# Patient Record
Sex: Female | Born: 1975 | Race: Black or African American | Hispanic: No | Marital: Single | Smoking: Never smoker
Health system: Southern US, Community
[De-identification: ages and names within clinical notes are randomized; demographics above are authoritative.]

## PROBLEM LIST (undated history)

## (undated) DIAGNOSIS — M1990 Unspecified osteoarthritis, unspecified site: Secondary | ICD-10-CM

## (undated) DIAGNOSIS — K219 Gastro-esophageal reflux disease without esophagitis: Secondary | ICD-10-CM

## (undated) DIAGNOSIS — D649 Anemia, unspecified: Secondary | ICD-10-CM

## (undated) DIAGNOSIS — E0500 Thyrotoxicosis with diffuse goiter without thyrotoxic crisis or storm: Secondary | ICD-10-CM

## (undated) DIAGNOSIS — E079 Disorder of thyroid, unspecified: Secondary | ICD-10-CM

## (undated) DIAGNOSIS — I10 Essential (primary) hypertension: Secondary | ICD-10-CM

## (undated) DIAGNOSIS — F419 Anxiety disorder, unspecified: Secondary | ICD-10-CM

## (undated) HISTORY — PX: LESION REMOVAL: SHX5196

## (undated) HISTORY — DX: Unspecified osteoarthritis, unspecified site: M19.90

## (undated) HISTORY — DX: Anemia, unspecified: D64.9

## (undated) HISTORY — DX: Gastro-esophageal reflux disease without esophagitis: K21.9

## (undated) HISTORY — DX: Thyrotoxicosis with diffuse goiter without thyrotoxic crisis or storm: E05.00

## (undated) HISTORY — DX: Disorder of thyroid, unspecified: E07.9

## (undated) HISTORY — DX: Anxiety disorder, unspecified: F41.9

## (undated) HISTORY — PX: WISDOM TOOTH EXTRACTION: SHX21

---

## 2016-01-06 DIAGNOSIS — I10 Essential (primary) hypertension: Secondary | ICD-10-CM | POA: Diagnosis not present

## 2016-02-10 ENCOUNTER — Encounter (HOSPITAL_COMMUNITY): Payer: Self-pay | Admitting: Emergency Medicine

## 2016-02-10 ENCOUNTER — Emergency Department (HOSPITAL_COMMUNITY): Payer: BLUE CROSS/BLUE SHIELD

## 2016-02-10 ENCOUNTER — Inpatient Hospital Stay (HOSPITAL_COMMUNITY)
Admission: EM | Admit: 2016-02-10 | Discharge: 2016-02-12 | DRG: 690 | Disposition: A | Discharge status: Home / Self Care | Payer: BLUE CROSS/BLUE SHIELD | Attending: Internal Medicine | Admitting: Internal Medicine

## 2016-02-10 DIAGNOSIS — R000 Tachycardia, unspecified: Secondary | ICD-10-CM | POA: Diagnosis present

## 2016-02-10 DIAGNOSIS — R1110 Vomiting, unspecified: Secondary | ICD-10-CM | POA: Diagnosis not present

## 2016-02-10 DIAGNOSIS — D509 Iron deficiency anemia, unspecified: Secondary | ICD-10-CM | POA: Diagnosis not present

## 2016-02-10 DIAGNOSIS — R112 Nausea with vomiting, unspecified: Secondary | ICD-10-CM | POA: Diagnosis not present

## 2016-02-10 DIAGNOSIS — R1013 Epigastric pain: Secondary | ICD-10-CM | POA: Diagnosis not present

## 2016-02-10 DIAGNOSIS — E872 Acidosis: Secondary | ICD-10-CM | POA: Diagnosis present

## 2016-02-10 DIAGNOSIS — N920 Excessive and frequent menstruation with regular cycle: Secondary | ICD-10-CM | POA: Diagnosis not present

## 2016-02-10 DIAGNOSIS — I10 Essential (primary) hypertension: Secondary | ICD-10-CM | POA: Diagnosis not present

## 2016-02-10 DIAGNOSIS — D259 Leiomyoma of uterus, unspecified: Secondary | ICD-10-CM | POA: Diagnosis not present

## 2016-02-10 DIAGNOSIS — N390 Urinary tract infection, site not specified: Principal | ICD-10-CM | POA: Diagnosis present

## 2016-02-10 DIAGNOSIS — E860 Dehydration: Secondary | ICD-10-CM | POA: Diagnosis not present

## 2016-02-10 HISTORY — DX: Essential (primary) hypertension: I10

## 2016-02-10 LAB — URINALYSIS, ROUTINE W REFLEX MICROSCOPIC
Bilirubin Urine: NEGATIVE
Glucose, UA: NEGATIVE mg/dL
Ketones, ur: >80 mg/dL — AB
Nitrite: NEGATIVE
Protein, ur: NEGATIVE mg/dL
Specific Gravity, Urine: 1.027 (ref 1.005–1.030)
pH: 5.5 (ref 5.0–8.0)

## 2016-02-10 LAB — CBC WITH DIFFERENTIAL/PLATELET
Basophils Absolute: 0 10*3/uL (ref 0.0–0.1)
Basophils Relative: 0 %
Eosinophils Absolute: 0 10*3/uL (ref 0.0–0.7)
Eosinophils Relative: 0 %
HCT: 39.7 % (ref 36.0–46.0)
Hemoglobin: 12.9 g/dL (ref 12.0–15.0)
Lymphocytes Relative: 27 %
Lymphs Abs: 2.2 10*3/uL (ref 0.7–4.0)
MCH: 25.5 pg — ABNORMAL LOW (ref 26.0–34.0)
MCHC: 32.5 g/dL (ref 30.0–36.0)
MCV: 78.5 fL (ref 78.0–100.0)
Monocytes Absolute: 1.1 10*3/uL — ABNORMAL HIGH (ref 0.1–1.0)
Monocytes Relative: 14 %
Neutro Abs: 4.8 10*3/uL (ref 1.7–7.7)
Neutrophils Relative %: 59 %
Platelets: 325 10*3/uL (ref 150–400)
RBC: 5.06 MIL/uL (ref 3.87–5.11)
RDW: 15.2 % (ref 11.5–15.5)
WBC: 8.1 10*3/uL (ref 4.0–10.5)

## 2016-02-10 LAB — LIPASE, BLOOD: Lipase: 37 U/L (ref 11–51)

## 2016-02-10 LAB — COMPREHENSIVE METABOLIC PANEL
ALT: 59 U/L — ABNORMAL HIGH (ref 14–54)
AST: 52 U/L — ABNORMAL HIGH (ref 15–41)
Albumin: 4.4 g/dL (ref 3.5–5.0)
Alkaline Phosphatase: 74 U/L (ref 38–126)
Anion gap: 15 (ref 5–15)
BUN: 39 mg/dL — ABNORMAL HIGH (ref 6–20)
CO2: 19 mmol/L — ABNORMAL LOW (ref 22–32)
Calcium: 10.6 mg/dL — ABNORMAL HIGH (ref 8.9–10.3)
Chloride: 110 mmol/L (ref 101–111)
Creatinine, Ser: 0.7 mg/dL (ref 0.44–1.00)
GFR calc Af Amer: >60 mL/min (ref >60)
GFR calc non Af Amer: >60 mL/min (ref >60)
Glucose, Bld: 103 mg/dL — ABNORMAL HIGH (ref 65–99)
Potassium: 3.8 mmol/L (ref 3.5–5.1)
Sodium: 144 mmol/L (ref 135–145)
Total Bilirubin: 1.4 mg/dL — ABNORMAL HIGH (ref 0.3–1.2)
Total Protein: 9.2 g/dL — ABNORMAL HIGH (ref 6.5–8.1)

## 2016-02-10 LAB — URINE MICROSCOPIC-ADD ON

## 2016-02-10 LAB — PREGNANCY, URINE: Preg Test, Ur: NEGATIVE

## 2016-02-10 MED ORDER — ONDANSETRON HCL 4 MG/2ML IJ SOLN
4.0000 mg | Freq: Once | INTRAMUSCULAR | Status: AC
  Administered 2016-02-10: 4 mg via INTRAVENOUS
  Filled 2016-02-10: qty 2

## 2016-02-10 MED ORDER — SODIUM CHLORIDE 0.9 % IV BOLUS (SEPSIS)
1000.0000 mL | Freq: Once | INTRAVENOUS | Status: AC
  Administered 2016-02-10: 1000 mL via INTRAVENOUS

## 2016-02-10 MED ORDER — IOPAMIDOL (ISOVUE-300) INJECTION 61%
100.0000 mL | Freq: Once | INTRAVENOUS | Status: AC | PRN
  Administered 2016-02-10: 100 mL via INTRAVENOUS

## 2016-02-10 MED ORDER — FAMOTIDINE IN NACL 20-0.9 MG/50ML-% IV SOLN
20.0000 mg | Freq: Once | INTRAVENOUS | Status: AC
  Administered 2016-02-10: 20 mg via INTRAVENOUS
  Filled 2016-02-10: qty 50

## 2016-02-10 MED ORDER — PROMETHAZINE HCL 25 MG/ML IJ SOLN
12.5000 mg | Freq: Once | INTRAMUSCULAR | Status: AC
  Administered 2016-02-10: 12.5 mg via INTRAVENOUS
  Filled 2016-02-10: qty 1

## 2016-02-10 MED ORDER — SODIUM CHLORIDE 0.9 % IV BOLUS (SEPSIS)
1000.0000 mL | Freq: Once | INTRAVENOUS | Status: AC
  Administered 2016-02-11: 1000 mL via INTRAVENOUS

## 2016-02-10 MED ORDER — SODIUM CHLORIDE 0.9 % IV BOLUS (SEPSIS)
2000.0000 mL | Freq: Once | INTRAVENOUS | Status: AC
  Administered 2016-02-10: 2000 mL via INTRAVENOUS

## 2016-02-10 MED ORDER — DEXTROSE 5 % IV SOLN
1.0000 g | Freq: Once | INTRAVENOUS | Status: AC
  Administered 2016-02-10: 1 g via INTRAVENOUS
  Filled 2016-02-10: qty 10

## 2016-02-10 MED ORDER — KETOROLAC TROMETHAMINE 15 MG/ML IJ SOLN
15.0000 mg | Freq: Once | INTRAMUSCULAR | Status: AC
  Administered 2016-02-10: 15 mg via INTRAVENOUS
  Filled 2016-02-10: qty 1

## 2016-02-10 NOTE — ED Notes (Signed)
Per pt, states body aches since last Friday-states she started vomiting on Sunday-thought she was getting better on Wed but symptoms returned-saw MD this past Monday and was given nausea meds-did not work

## 2016-02-10 NOTE — ED Notes (Signed)
MD at bedside. 

## 2016-02-10 NOTE — ED Notes (Signed)
Per Dr. Juleen ChinaKohut patient can have PO fluids. Pt given water.

## 2016-02-10 NOTE — ED Provider Notes (Signed)
CSN: [id number]     Arrival date & time 02/10/16  1624 History  By signing my name below, I, Tanda Rockers, attest that this documentation has been prepared under the direction and in the presence of Raeford Razor, MD. Electronically Signed: Tanda Rockers, ED Scribe. 02/10/2016. 4:57 PM.    Chief Complaint  Patient presents with  . elevated HR    The history is provided by the patient. No language interpreter was used.     HPI Comments: Beth Ruiz is a 40 y.o. female with PMHx HTN, who presents to the Emergency Department complaining of gradual onset, constant, achy, epigastric abdominal pain x 1 week. She also complains of nausea, vomiting, and generalized body aches. Pt was seen by her PCP earlier this week and was prescribed Reglan without relief. She reports that she vomits shortly after eating or drinking anything. Her abdominal pain is mildly alleviated for a couple of minutes after vomiting. No recent sick contact. No EtOH use. No risk of pregnancy. No previous abdominal surgeries. Denies back pain, chest pain, shortness of breath, diarrhea, urinary symptoms, polyuria, polydipsia, or any other associated symptoms. No hx DM. Pt had her blood sugar tested a couple of weeks ago which was within normal limits.   Past Medical History  Diagnosis Date  . Hypertension    History reviewed. No pertinent past surgical history. No family history on file. Social History  Substance Use Topics  . Smoking status: Never Smoker   . Smokeless tobacco: None  . Alcohol Use: No   OB History    No data available     Review of Systems  Respiratory: Negative for shortness of breath.   Cardiovascular: Negative for chest pain.  Gastrointestinal: Positive for nausea, vomiting and abdominal pain. Negative for diarrhea.  Endocrine: Negative for polydipsia and polyuria.  Genitourinary: Negative for dysuria, hematuria and difficulty urinating.  Musculoskeletal: Positive for myalgias. Negative for  back pain.  All other systems reviewed and are negative.  Allergies  Review of patient's allergies indicates not on file.  Home Medications   Prior to Admission medications   Not on File   BP 130/91 mmHg  Pulse 172  Temp(Src) 99.6 F (37.6 C) (Oral)  Resp 24  SpO2 100%  LMP 02/03/2016   Physical Exam  Constitutional: She is oriented to person, place, and time. She appears well-developed and well-nourished. No distress.  Uncomfortable appearing  HENT:  Head: Normocephalic and atraumatic.  Eyes: EOM are normal.  Neck: Normal range of motion.  Cardiovascular: Regular rhythm and normal heart sounds.  Tachycardia present.   Pulmonary/Chest: Effort normal and breath sounds normal.  Abdominal: Soft. She exhibits no distension. There is tenderness.  Epigastric tenderness  Musculoskeletal: Normal range of motion.  Neurological: She is alert and oriented to person, place, and time.  Skin: Skin is warm and dry.  Psychiatric: She has a normal mood and affect. Judgment normal.  Nursing note and vitals reviewed.   ED Course  Procedures (including critical care time)  DIAGNOSTIC STUDIES: Oxygen Saturation is 100% on RA, normal by my interpretation.    COORDINATION OF CARE: 4:55 PM-Discussed treatment plan which includes UA, CMP, CBC, Lipase, urine preg with pt at bedside and pt agreed to plan.   Labs Review Labs Reviewed  URINALYSIS, ROUTINE W REFLEX MICROSCOPIC (NOT AT [facility]) - Abnormal; Notable for the following:    Color, Urine AMBER (*)    APPearance CLOUDY (*)    Hgb urine dipstick LARGE (*)  Ketones, ur >80 (*)    Leukocytes, UA SMALL (*)    All other components within normal limits  COMPREHENSIVE METABOLIC PANEL - Abnormal; Notable for the following:    CO2 19 (*)    Glucose, Bld 103 (*)    BUN 39 (*)    Calcium 10.6 (*)    Total Protein 9.2 (*)    AST 52 (*)    ALT 59 (*)    Total Bilirubin 1.4 (*)    All other components within normal limits  CBC WITH  DIFFERENTIAL/PLATELET - Abnormal; Notable for the following:    MCH 25.5 (*)    Monocytes Absolute 1.1 (*)    All other components within normal limits  URINE MICROSCOPIC-ADD ON - Abnormal; Notable for the following:    Squamous Epithelial / LPF 0-5 (*)    Bacteria, UA MANY (*)    All other components within normal limits  URINE CULTURE  LIPASE, BLOOD  PREGNANCY, URINE    Imaging Review Ct Abdomen Pelvis W Contrast  02/10/2016  CLINICAL DATA:  Body aches since last Friday and vomiting started on Sunday. Persistent symptoms. EXAM: CT ABDOMEN AND PELVIS WITH CONTRAST TECHNIQUE: Multidetector CT imaging of the abdomen and pelvis was performed using the standard protocol following bolus administration of intravenous contrast. CONTRAST:  ISOVUE-300 IOPAMIDOL (ISOVUE-300) INJECTION 61% COMPARISON:  None. FINDINGS: Lower chest:  Unremarkable Hepatobiliary: No focal abnormality within the liver parenchyma. There is no evidence for gallstones, gallbladder wall thickening, or pericholecystic fluid. No intrahepatic or extrahepatic biliary dilation. Pancreas: No focal mass lesion. No dilatation of the main duct. No intraparenchymal cyst. No peripancreatic edema. Spleen: No splenomegaly. No focal mass lesion. Adrenals/Urinary Tract: No adrenal nodule or mass. Kidneys have normal imaging features. No evidence for hydroureter. The urinary bladder appears normal for the degree of distention. Stomach/Bowel: Stomach is nondistended. No gastric wall thickening. No evidence of outlet obstruction. Duodenum is normally positioned as is the ligament of Treitz. No small bowel wall thickening. No small bowel dilatation. The terminal ileum is normal. The appendix is normal. No gross colonic mass. No colonic wall thickening. No substantial diverticular change. Vascular/Lymphatic: No abdominal aortic aneurysm. No abdominal aortic atherosclerotic calcification. There is no gastrohepatic or hepatoduodenal ligament  lymphadenopathy. No intraperitoneal or retroperitoneal lymphadenopathy. No pelvic sidewall lymphadenopathy. Reproductive: 4.5 x 4.2 x 4.1 cm submucosal fundal fibroid noted in the uterus. There is no adnexal mass. Other: No intraperitoneal free fluid. Musculoskeletal: Bone windows reveal no worrisome lytic or sclerotic osseous lesions. IMPRESSION: 1. No acute findings in the abdomen or pelvis. Specifically, no findings to explain the patient's history of body aches with nausea and vomiting. 2. Uterine fibroids. Electronically Signed   By: Kennith Center M.D.   On: 02/10/2016 22:38   I have personally reviewed and evaluated these images and lab results as part of my medical decision-making.   EKG Interpretation   Date/Time:  Friday February 10 2016 16:49:56 EDT Ventricular Rate:  129 PR Interval:    QRS Duration: 75 QT Interval:  324 QTC Calculation: 475 R Axis:   74 Text Interpretation:  Sinus tachycardia Consider right atrial enlargement  Non-specific ST-t changes Confirmed by Juleen China  MD, Japleen Tornow ([id number]) on  02/10/2016 4:59:18 PM      MDM   Final diagnoses:  Intractable vomiting with nausea, vomiting of unspecified type    40 year old female with abdominal pain and nausea/vomiting. Symptoms improved but remains fairly symptomatic after IV fluids and antiemetics. She remains with a heart  rate 120s to 130s at rest. UA with possible UTI. Rocephin ordered. Large amount of ketones and mild metabolic acidosis. Will admit overnight for observation and continued hydration/symptom treatment. Gastritis? CT w/o acute abnormality. Not pregnant.   I personally preformed the services scribed in my presence. The recorded information has been reviewed is accurate. Raeford RazorStephen Armelia Penton, MD.      Raeford RazorStephen Pattijo Juste, MD 02/10/16 [phone]

## 2016-02-10 NOTE — ED Notes (Signed)
Pt to CT

## 2016-02-10 NOTE — Progress Notes (Signed)
Patient listed as not having insurance or a pcp.  EDCM spoke to patient at bedside.  Patient reports she has [employer] insurance through her school at [employer].  EDCM instructed patient to call the phone number on the back of her insurance card or go to insurance company web site to assist her in finding a pcp who is close to her and within network.  Patient thankful for assistance.  No further EDCM needs at this time.

## 2016-02-11 ENCOUNTER — Encounter (HOSPITAL_COMMUNITY): Payer: Self-pay | Admitting: Nurse Practitioner

## 2016-02-11 DIAGNOSIS — E872 Acidosis: Secondary | ICD-10-CM | POA: Diagnosis present

## 2016-02-11 DIAGNOSIS — R1110 Vomiting, unspecified: Secondary | ICD-10-CM

## 2016-02-11 DIAGNOSIS — N920 Excessive and frequent menstruation with regular cycle: Secondary | ICD-10-CM | POA: Diagnosis present

## 2016-02-11 DIAGNOSIS — E860 Dehydration: Secondary | ICD-10-CM | POA: Diagnosis present

## 2016-02-11 DIAGNOSIS — R112 Nausea with vomiting, unspecified: Secondary | ICD-10-CM | POA: Diagnosis present

## 2016-02-11 DIAGNOSIS — R000 Tachycardia, unspecified: Secondary | ICD-10-CM | POA: Diagnosis present

## 2016-02-11 DIAGNOSIS — I10 Essential (primary) hypertension: Secondary | ICD-10-CM | POA: Diagnosis present

## 2016-02-11 DIAGNOSIS — D509 Iron deficiency anemia, unspecified: Secondary | ICD-10-CM | POA: Diagnosis present

## 2016-02-11 DIAGNOSIS — N390 Urinary tract infection, site not specified: Secondary | ICD-10-CM | POA: Diagnosis present

## 2016-02-11 LAB — CBC
HEMATOCRIT: 28.2 % — AB (ref 36.0–46.0)
HEMOGLOBIN: 9 g/dL — AB (ref 12.0–15.0)
MCH: 25.4 pg — AB (ref 26.0–34.0)
MCHC: 31.9 g/dL (ref 30.0–36.0)
MCV: 79.7 fL (ref 78.0–100.0)
PLATELETS: 220 10*3/uL (ref 150–400)
RBC: 3.54 MIL/uL — AB (ref 3.87–5.11)
RDW: 15.7 % — ABNORMAL HIGH (ref 11.5–15.5)
WBC: 5.3 10*3/uL (ref 4.0–10.5)

## 2016-02-11 LAB — BASIC METABOLIC PANEL
ANION GAP: 3 — AB (ref 5–15)
BUN: 22 mg/dL — AB (ref 6–20)
CHLORIDE: 120 mmol/L — AB (ref 101–111)
CO2: 22 mmol/L (ref 22–32)
Calcium: 8.5 mg/dL — ABNORMAL LOW (ref 8.9–10.3)
Creatinine, Ser: 0.47 mg/dL (ref 0.44–1.00)
GFR calc Af Amer: >60 mL/min (ref >60)
GLUCOSE: 105 mg/dL — AB (ref 65–99)
Potassium: 3.7 mmol/L (ref 3.5–5.1)
Sodium: 145 mmol/L (ref 135–145)

## 2016-02-11 LAB — IRON AND TIBC
IRON: 153 ug/dL (ref 28–170)
SATURATION RATIOS: 61 % — AB (ref 10.4–31.8)
TIBC: 252 ug/dL (ref 250–450)
UIBC: 99 ug/dL

## 2016-02-11 LAB — RETICULOCYTES
RBC.: 3.86 MIL/uL — ABNORMAL LOW (ref 3.87–5.11)
RETIC CT PCT: 1.2 % (ref 0.4–3.1)
Retic Count, Absolute: 46.3 10*3/uL (ref 19.0–186.0)

## 2016-02-11 LAB — LACTIC ACID, PLASMA: LACTIC ACID, VENOUS: 1.2 mmol/L (ref 0.5–1.9)

## 2016-02-11 LAB — FOLATE: Folate: 26.1 ng/mL (ref >5.9)

## 2016-02-11 LAB — VITAMIN B12: Vitamin B-12: 2172 pg/mL — ABNORMAL HIGH (ref 180–914)

## 2016-02-11 LAB — FERRITIN: FERRITIN: 49 ng/mL (ref 11–307)

## 2016-02-11 MED ORDER — SODIUM CHLORIDE 0.9 % IV SOLN
INTRAVENOUS | Status: DC
  Administered 2016-02-11 – 2016-02-12 (×3): via INTRAVENOUS

## 2016-02-11 MED ORDER — SODIUM CHLORIDE 0.9% FLUSH
3.0000 mL | Freq: Two times a day (BID) | INTRAVENOUS | Status: DC
  Administered 2016-02-11 (×3): 3 mL via INTRAVENOUS

## 2016-02-11 MED ORDER — KETOROLAC TROMETHAMINE 15 MG/ML IJ SOLN: 15.0000 mg | Freq: Four times a day (QID) | INTRAMUSCULAR | Status: DC | PRN

## 2016-02-11 MED ORDER — DEXTROSE 5 % IV SOLN: 1.0000 g | Freq: Once | INTRAVENOUS | Status: DC

## 2016-02-11 MED ORDER — DEXTROSE 5 % IV SOLN
1.0000 g | INTRAVENOUS | Status: DC
  Administered 2016-02-11: 1 g via INTRAVENOUS
  Filled 2016-02-11 (×2): qty 10

## 2016-02-11 MED ORDER — ONDANSETRON HCL 4 MG/2ML IJ SOLN
4.0000 mg | Freq: Four times a day (QID) | INTRAMUSCULAR | Status: DC | PRN
  Administered 2016-02-11: 4 mg via INTRAVENOUS
  Filled 2016-02-11: qty 2

## 2016-02-11 MED ORDER — ENOXAPARIN SODIUM 40 MG/0.4ML ~~LOC~~ SOLN
40.0000 mg | SUBCUTANEOUS | Status: DC
  Filled 2016-02-11: qty 0.4

## 2016-02-11 NOTE — H&P (Signed)
History and Physical    Christinia Lambeth ZOX:[id number] DOB: [birth year] DOA: 02/10/2016   PCP: No primary care provider on file. Chief Complaint:  Chief Complaint  Patient presents with  . elevated HR     HPI: Beth Ruiz is a 40 y.o. female with medical history significant of HTN.  Patient presents to the ED with intractable nausea and vomiting.  Symptoms onset on Sunday, have been persistent.  Went to PCP and was prescribed reglan which provided no relief.  No sick contacts, no H/O DM.  Vomits shortly after eating or drinking anything.  ED Course: Symptoms improved but due to persistent tachycardia EDP wants overnight obs.  Review of Systems: As per HPI otherwise 10 point review of systems negative.    Past Medical History  Diagnosis Date  . Hypertension     History reviewed. No pertinent past surgical history.   reports that she has never smoked. She does not have any smokeless tobacco history on file. She reports that she does not drink alcohol. Her drug history is not on file.  No Known Allergies  No family history on file. No recent sick contacts at all in family.   Prior to Admission medications   Medication Sig Start Date End Date Taking? Authorizing Provider  hydrochlorothiazide (HYDRODIURIL) 25 MG tablet Take 25 mg by mouth daily.  02/06/16  Yes Historical Provider, MD    Physical Exam: Filed Vitals:   02/10/16 1641 02/10/16 1728 02/10/16 1837 02/10/16 1900  BP: 130/91 149/75 126/65 128/70  Pulse: 172 113 125 127  Temp: 99.6 F (37.6 C)     TempSrc: Oral     Resp: SpO2: 100% 100% 100% 99%      Constitutional: NAD, calm, comfortable Eyes: PERRL, lids and conjunctivae normal ENMT: Mucous membranes are moist. Posterior pharynx clear of any exudate or lesions.Normal dentition.  Neck: normal, supple, no masses, no thyromegaly Respiratory: clear to auscultation bilaterally, no wheezing, no crackles. Normal respiratory effort. No accessory  muscle use.  Cardiovascular: Regular rate and rhythm, no murmurs / rubs / gallops. No extremity edema. 2+ pedal pulses. No carotid bruits.  Abdomen: no tenderness, no masses palpated. No hepatosplenomegaly. Bowel sounds positive.  Musculoskeletal: no clubbing / cyanosis. No joint deformity upper and lower extremities. Good ROM, no contractures. Normal muscle tone.  Skin: no rashes, lesions, ulcers. No induration Neurologic: CN 2-12 grossly intact. Sensation intact, DTR normal. Strength 5/5 in all 4.  Psychiatric: Normal judgment and insight. Alert and oriented x 3. Normal mood.    Labs on Admission: I have personally reviewed following labs and imaging studies  CBC:  Recent Labs Lab 02/10/16 1702  WBC 8.1  NEUTROABS 4.8  HGB 12.9  HCT 39.7  MCV 78.5  PLT 325   Basic Metabolic Panel:  Recent Labs Lab 02/10/16 1702  NA 144  K 3.8  CL 110  CO2 19*  GLUCOSE 103*  BUN 39*  CREATININE 0.70  CALCIUM 10.6*   GFR: CrCl cannot be calculated (Unknown ideal weight.). Liver Function Tests:  Recent Labs Lab 02/10/16 1702  AST 52*  ALT 59*  ALKPHOS 74  BILITOT 1.4*  PROT 9.2*  ALBUMIN 4.4    Recent Labs Lab 02/10/16 1702  LIPASE 37   No results for input(s): AMMONIA in the last 168 hours. Coagulation Profile: No results for input(s): INR, PROTIME in the last 168 hours. Cardiac Enzymes: No results for input(s): CKTOTAL, CKMB, CKMBINDEX, TROPONINI in the last 168 hours. BNP (  last 3 results) No results for input(s): PROBNP in the last 8760 hours. HbA1C: No results for input(s): HGBA1C in the last 72 hours. CBG: No results for input(s): GLUCAP in the last 168 hours. Lipid Profile: No results for input(s): CHOL, HDL, LDLCALC, TRIG, CHOLHDL, LDLDIRECT in the last 72 hours. Thyroid Function Tests: No results for input(s): TSH, T4TOTAL, FREET4, T3FREE, THYROIDAB in the last 72 hours. Anemia Panel: No results for input(s): VITAMINB12, FOLATE, FERRITIN, TIBC, IRON,  RETICCTPCT in the last 72 hours. Urine analysis:    Component Value Date/Time   COLORURINE AMBER* 02/10/2016 1936   APPEARANCEUR CLOUDY* 02/10/2016 1936   LABSPEC 1.027 02/10/2016 1936   PHURINE 5.5 02/10/2016 1936   GLUCOSEU NEGATIVE 02/10/2016 1936   HGBUR LARGE* 02/10/2016 1936   BILIRUBINUR NEGATIVE 02/10/2016 1936   KETONESUR >80* 02/10/2016 1936   PROTEINUR NEGATIVE 02/10/2016 1936   NITRITE NEGATIVE 02/10/2016 1936   LEUKOCYTESUR SMALL* 02/10/2016 1936   Sepsis Labs: @LABRCNTIP (procalcitonin:4,lacticidven:4) )No results found for this or any previous visit (from the past 240 hour(s)).   Radiological Exams on Admission: Ct Abdomen Pelvis W Contrast  02/10/2016  CLINICAL DATA:  Body aches since last Friday and vomiting started on Sunday. Persistent symptoms. EXAM: CT ABDOMEN AND PELVIS WITH CONTRAST TECHNIQUE: Multidetector CT imaging of the abdomen and pelvis was performed using the standard protocol following bolus administration of intravenous contrast. CONTRAST:  100mL ISOVUE-300 IOPAMIDOL (ISOVUE-300) INJECTION 61% COMPARISON:  None. FINDINGS: Lower chest:  Unremarkable Hepatobiliary: No focal abnormality within the liver parenchyma. There is no evidence for gallstones, gallbladder wall thickening, or pericholecystic fluid. No intrahepatic or extrahepatic biliary dilation. Pancreas: No focal mass lesion. No dilatation of the main duct. No intraparenchymal cyst. No peripancreatic edema. Spleen: No splenomegaly. No focal mass lesion. Adrenals/Urinary Tract: No adrenal nodule or mass. Kidneys have normal imaging features. No evidence for hydroureter. The urinary bladder appears normal for the degree of distention. Stomach/Bowel: Stomach is nondistended. No gastric wall thickening. No evidence of outlet obstruction. Duodenum is normally positioned as is the ligament of Treitz. No small bowel wall thickening. No small bowel dilatation. The terminal ileum is normal. The appendix is normal.  No gross colonic mass. No colonic wall thickening. No substantial diverticular change. Vascular/Lymphatic: No abdominal aortic aneurysm. No abdominal aortic atherosclerotic calcification. There is no gastrohepatic or hepatoduodenal ligament lymphadenopathy. No intraperitoneal or retroperitoneal lymphadenopathy. No pelvic sidewall lymphadenopathy. Reproductive: 4.5 x 4.2 x 4.1 cm submucosal fundal fibroid noted in the uterus. There is no adnexal mass. Other: No intraperitoneal free fluid. Musculoskeletal: Bone windows reveal no worrisome lytic or sclerotic osseous lesions. IMPRESSION: 1. No acute findings in the abdomen or pelvis. Specifically, no findings to explain the patient's history of body aches with nausea and vomiting. 2. Uterine fibroids. Electronically Signed   By: Kennith CenterEric  Mansell M.D.   On: 02/10/2016 22:38    EKG: Independently reviewed.  Assessment/Plan Principal Problem:   Intractable nausea and vomiting Active Problems:   HTN (hypertension)   Tachycardia   Intractable nausea and vomiting -  CT negative  Working diagnosis is gastritis at this point  Supportive care  If persists then may need GI consult  HTN - holding HCTZ  Tachycardia -  Improved with IVF but still present  Tele monitor   DVT prophylaxis: Lovenox Code Status: Full Family Communication: None in room Consults called: None Admission status: Admit to obs   Hillary BowGARDNER, Shekira Drummer M. DO [facility] Pager [phone] from 7PM-7AM  If 7AM-7PM, please contact the day physician  for the patient [email or web address] Password [email or web address]  02/11/2016, 12:03 AM

## 2016-02-11 NOTE — Progress Notes (Signed)
  PROGRESS NOTE Beth Ruiz is a 40 y.o. female with medical history significant of HTN. Patient presents to the ED with intractable nausea and vomiting. She was admitted earlier this am and please see Dr Boston ServiceGardner's note in detail for H&P.  She was found to have urinary tract infection and was started on IV rocephin. Urine cultures ordered and pending.  She was also found to have hemoglobin of 9 , a drop from 12 from admission. Suspect the patient was dehydrated on admission. Obtain anemia, panel, get stool for occult blood and monitor . Patient also reports heavy menstrual cycles.  Continue to monitor. And advance her diet as tolerated as her symptoms have improved.  Anticipate possible d/c in am if her symptoms resolve by am.   Beth ModyVijaya Aydon Swamy, MD [phone]

## 2016-02-12 DIAGNOSIS — R1110 Vomiting, unspecified: Secondary | ICD-10-CM | POA: Insufficient documentation

## 2016-02-12 LAB — COMPREHENSIVE METABOLIC PANEL
ALT: 76 U/L — ABNORMAL HIGH (ref 14–54)
ANION GAP: 7 (ref 5–15)
AST: 53 U/L — ABNORMAL HIGH (ref 15–41)
Albumin: 2.8 g/dL — ABNORMAL LOW (ref 3.5–5.0)
Alkaline Phosphatase: 49 U/L (ref 38–126)
BUN: 10 mg/dL (ref 6–20)
CHLORIDE: 112 mmol/L — AB (ref 101–111)
CO2: 21 mmol/L — AB (ref 22–32)
Calcium: 8.6 mg/dL — ABNORMAL LOW (ref 8.9–10.3)
Creatinine, Ser: 0.4 mg/dL — ABNORMAL LOW (ref 0.44–1.00)
GFR calc non Af Amer: >60 mL/min (ref >60)
Glucose, Bld: 87 mg/dL (ref 65–99)
POTASSIUM: 3.6 mmol/L (ref 3.5–5.1)
SODIUM: 140 mmol/L (ref 135–145)
Total Bilirubin: 0.9 mg/dL (ref 0.3–1.2)
Total Protein: 5.8 g/dL — ABNORMAL LOW (ref 6.5–8.1)

## 2016-02-12 LAB — CBC
HCT: 29.8 % — ABNORMAL LOW (ref 36.0–46.0)
Hemoglobin: 9.3 g/dL — ABNORMAL LOW (ref 12.0–15.0)
MCH: 24.9 pg — AB (ref 26.0–34.0)
MCHC: 31.2 g/dL (ref 30.0–36.0)
MCV: 79.7 fL (ref 78.0–100.0)
PLATELETS: 209 10*3/uL (ref 150–400)
RBC: 3.74 MIL/uL — AB (ref 3.87–5.11)
RDW: 15.4 % (ref 11.5–15.5)
WBC: 6.6 10*3/uL (ref 4.0–10.5)

## 2016-02-12 LAB — URINE CULTURE: Culture: NO GROWTH

## 2016-02-12 MED ORDER — SODIUM CHLORIDE 0.9 % IV BOLUS (SEPSIS): 500.0000 mL | Freq: Once | INTRAVENOUS | Status: DC

## 2016-02-12 MED ORDER — FERROUS SULFATE 325 (65 FE) MG PO TABS: 325.0000 mg | ORAL_TABLET | Freq: Two times a day (BID) | ORAL | Status: DC

## 2016-02-12 MED ORDER — CEFPODOXIME PROXETIL 200 MG PO TABS: 200.0000 mg | ORAL_TABLET | Freq: Two times a day (BID) | ORAL | Status: DC

## 2016-02-12 MED ORDER — SODIUM CHLORIDE 0.9 % IV BOLUS (SEPSIS)
500.0000 mL | Freq: Once | INTRAVENOUS | Status: AC
  Administered 2016-02-12: 500 mL via INTRAVENOUS

## 2016-02-12 MED ORDER — FERROUS SULFATE 325 (65 FE) MG PO TABS: 325.0000 mg | ORAL_TABLET | Freq: Two times a day (BID) | ORAL | Status: AC

## 2016-02-12 MED ORDER — SODIUM CHLORIDE 0.9 % IV BOLUS (SEPSIS)
1000.0000 mL | Freq: Once | INTRAVENOUS | Status: AC
  Administered 2016-02-12: 1000 mL via INTRAVENOUS

## 2016-02-12 NOTE — Progress Notes (Signed)
Pt reports no BM x 3 days on-call provider made aware and requested medication for relief.

## 2016-02-12 NOTE — Discharge Instructions (Signed)
Follow with Primary MD Liberty HandyWalker, Tonya R, NP in 2-3 days   Get CBC, CMP, 2 view Chest X ray checked  by Primary MD or SNF MD in 5-7 days ( we routinely change or add medications that can affect your baseline labs and fluid status, therefore we recommend that you get the mentioned basic workup next visit with your PCP, your PCP may decide not to get them or add new tests based on their clinical decision)   Activity: As tolerated with Full fall precautions use walker/cane & assistance as needed   Disposition Home     Diet:   Heart Healthy    On your next visit with your primary care physician please Get Medicines reviewed and adjusted.   Please request your Prim.MD to go over all Hospital Tests and Procedure/Radiological results at the follow up, please get all Hospital records sent to your Prim MD by signing hospital release before you go home.   If you experience worsening of your admission symptoms, develop shortness of breath, life threatening emergency, suicidal or homicidal thoughts you must seek medical attention immediately by calling 911 or calling your MD immediately  if symptoms less severe.  You Must read complete instructions/literature along with all the possible adverse reactions/side effects for all the Medicines you take and that have been prescribed to you. Take any new Medicines after you have completely understood and accpet all the possible adverse reactions/side effects.   Do not drive, operate heavy machinery, perform activities at heights, swimming or participation in water activities or provide baby sitting services if your were admitted for syncope or siezures until you have seen by Primary MD or a Neurologist and advised to do so again.  Do not drive when taking Pain medications.    Do not take more than prescribed Pain, Sleep and Anxiety Medications  Special Instructions: If you have smoked or chewed Tobacco  in the last 2 yrs please stop smoking, stop any  regular Alcohol  and or any Recreational drug use.  Wear Seat belts while driving.   Please note  You were cared for by a hospitalist during your hospital stay. If you have any questions about your discharge medications or the care you received while you were in the hospital after you are discharged, you can call the unit and asked to speak with the hospitalist on call if the hospitalist that took care of you is not available. Once you are discharged, your primary care physician will handle any further medical issues. Please note that NO REFILLS for any discharge medications will be authorized once you are discharged, as it is imperative that you return to your primary care physician (or establish a relationship with a primary care physician if you do not have one) for your aftercare needs so that they can reassess your need for medications and monitor your lab values.

## 2016-02-12 NOTE — Progress Notes (Signed)
Patient given discharge, medication, and follow up instructions, verbalized understanding, IV and telemetry removed, cab service to transport home.

## 2016-02-12 NOTE — Discharge Summary (Signed)
Beth Ruiz ZOX:[id number] DOB: 02/09/1976 DOA: 02/10/2016  PCP: Liberty Handy, NP  Admit date: 02/10/2016  Discharge date: 02/12/2016  Admitted From: Home   Disposition:  Home   Recommendations for Outpatient Follow-up:   Follow up with PCP in 1-2 weeks  PCP Please obtain BMP/CBC, 2 view CXR in 1week,  (see Discharge instructions)   PCP Please follow up on the following pending results: None   Home Health: None   Equipment/Devices: None  Discharge Condition: Stable   Consultations: None CODE STATUS: Full   Diet Recommendation: Heart healthy   Chief Complaint  Patient presents with  . elevated HR      Brief history of present illness from the day of admission and additional interim summary    Beth Ruiz is a 40 y.o. female with medical history significant of HTN. Patient presents to the ED with intractable nausea and vomiting. Symptoms onset on Sunday, have been persistent. Went to PCP and was prescribed reglan which provided no relief. No sick contacts, no H/O DM. Vomits shortly after eating or drinking anything.  ED Course: Symptoms improved but due to persistent tachycardia EDP wants overnight obs.   Hospital issues addressed     1.Gen. abdominal pain with nausea vomiting and dehydration caused by UTI - much improved with supportive care which included bowel rest, IV fluids and empiric IV Rocephin, afebrile, CT scan abdomen pelvis completely unremarkable, she is feeling much better and has tolerated diet, has been hydrated adequately, stop HCTZ upon discharge due to severe dehydration. Will have her follow with PCP for blood pressure monitoring and for repeat BMP in 3-4 days.  2. Severe dehydration due to combination of nausea vomiting and HCTZ. Stop offending medications, hydrated, she  is symptom free ambulated in the hallway will be discharged home.  3. Iron deficiency anemia in a patient with heavy menstrual periods. Placed on ferrous sulfate. Follow with PCP, quest primary care provider to monitor iron levels intermittently.   Discharge diagnosis     Principal Problem:   Intractable nausea and vomiting Active Problems:   HTN (hypertension)   Tachycardia   Uncontrollable vomiting    Discharge instructions        Discharge Instructions    Diet - low sodium heart healthy    Complete by:  As directed      Discharge instructions    Complete by:  As directed   Follow with Primary MD Liberty Handy, NP in 2-3 days   Get CBC, CMP, 2 view Chest X ray checked  by Primary MD or SNF MD in 5-7 days ( we routinely change or add medications that can affect your baseline labs and fluid status, therefore we recommend that you get the mentioned basic workup next visit with your PCP, your PCP may decide not to get them or add new tests based on their clinical decision)   Activity: As tolerated with Full fall precautions use walker/cane & assistance as needed   Disposition Home     Diet:  Heart Healthy    On your next visit with your primary care physician please Get Medicines reviewed and adjusted.   Please request your Prim.MD to go over all Hospital Tests and Procedure/Radiological results at the follow up, please get all Hospital records sent to your Prim MD by signing hospital release before you go home.   If you experience worsening of your admission symptoms, develop shortness of breath, life threatening emergency, suicidal or homicidal thoughts you must seek medical attention immediately by calling 911 or calling your MD immediately  if symptoms less severe.  You Must read complete instructions/literature along with all the possible adverse reactions/side effects for all the Medicines you take and that have been prescribed to you. Take any new Medicines after  you have completely understood and accpet all the possible adverse reactions/side effects.   Do not drive, operate heavy machinery, perform activities at heights, swimming or participation in water activities or provide baby sitting services if your were admitted for syncope or siezures until you have seen by Primary MD or a Neurologist and advised to do so again.  Do not drive when taking Pain medications.    Do not take more than prescribed Pain, Sleep and Anxiety Medications  Special Instructions: If you have smoked or chewed Tobacco  in the last 2 yrs please stop smoking, stop any regular Alcohol  and or any Recreational drug use.  Wear Seat belts while driving.   Please note  You were cared for by a hospitalist during your hospital stay. If you have any questions about your discharge medications or the care you received while you were in the hospital after you are discharged, you can call the unit and asked to speak with the hospitalist on call if the hospitalist that took care of you is not available. Once you are discharged, your primary care physician will handle any further medical issues. Please note that NO REFILLS for any discharge medications will be authorized once you are discharged, as it is imperative that you return to your primary care physician (or establish a relationship with a primary care physician if you do not have one) for your aftercare needs so that they can reassess your need for medications and monitor your lab values.     Increase activity slowly    Complete by:  As directed            Discharge Medications     Medication List    STOP taking these medications        hydrochlorothiazide 25 MG tablet  Commonly known as:  HYDRODIURIL      TAKE these medications        cefpodoxime 200 MG tablet  Commonly known as:  VANTIN  Take 1 tablet (200 mg total) by mouth 2 (two) times daily.     ferrous sulfate 325 (65 FE) MG tablet  Take 1 tablet (325 mg  total) by mouth 2 (two) times daily with a meal.        No Known Allergies  Follow-up Information    Follow up with Liberty Handy, NP. Schedule an appointment as soon as possible for a visit in 2 days.   Specialty:  Nurse Practitioner   Contact information:   [address] [place] [address] [phone]       Major procedures and Radiology Reports - PLEASE review detailed and final reports thoroughly  -         Ct Abdomen Pelvis W Contrast  02/10/2016  CLINICAL DATA:  Body aches since last Friday and vomiting started on Sunday. Persistent symptoms. EXAM: CT ABDOMEN AND PELVIS WITH CONTRAST TECHNIQUE: Multidetector CT imaging of the abdomen and pelvis was performed using the standard protocol following bolus administration of intravenous contrast. CONTRAST:  ISOVUE-300 IOPAMIDOL (ISOVUE-300) INJECTION 61% COMPARISON:  None. FINDINGS: Lower chest:  Unremarkable Hepatobiliary: No focal abnormality within the liver parenchyma. There is no evidence for gallstones, gallbladder wall thickening, or pericholecystic fluid. No intrahepatic or extrahepatic biliary dilation. Pancreas: No focal mass lesion. No dilatation of the main duct. No intraparenchymal cyst. No peripancreatic edema. Spleen: No splenomegaly. No focal mass lesion. Adrenals/Urinary Tract: No adrenal nodule or mass. Kidneys have normal imaging features. No evidence for hydroureter. The urinary bladder appears normal for the degree of distention. Stomach/Bowel: Stomach is nondistended. No gastric wall thickening. No evidence of outlet obstruction. Duodenum is normally positioned as is the ligament of Treitz. No small bowel wall thickening. No small bowel dilatation. The terminal ileum is normal. The appendix is normal. No gross colonic mass. No colonic wall thickening. No substantial diverticular change. Vascular/Lymphatic: No abdominal aortic aneurysm. No abdominal aortic atherosclerotic calcification. There is no gastrohepatic  or hepatoduodenal ligament lymphadenopathy. No intraperitoneal or retroperitoneal lymphadenopathy. No pelvic sidewall lymphadenopathy. Reproductive: 4.5 x 4.2 x 4.1 cm submucosal fundal fibroid noted in the uterus. There is no adnexal mass. Other: No intraperitoneal free fluid. Musculoskeletal: Bone windows reveal no worrisome lytic or sclerotic osseous lesions. IMPRESSION: 1. No acute findings in the abdomen or pelvis. Specifically, no findings to explain the patient's history of body aches with nausea and vomiting. 2. Uterine fibroids. Electronically Signed   By: Kennith Center M.D.   On: 02/10/2016 22:38    Micro Results      Recent Results (from the past 240 hour(s))  Urine culture     Status: None   Collection Time: 02/10/16  7:36 PM  Result Value Ref Range Status   Specimen Description URINE, RANDOM  Final   Special Requests NONE  Final   Culture NO GROWTH Performed at [facility]   Final   Report Status 02/12/2016 FINAL  Final    Today   Subjective    Beth Ruiz today has no headache,no chest abdominal pain,no new weakness tingling or numbness, feels much better wants to go home today.     Objective   Blood pressure 124/65, pulse 92, temperature 98.3 F (36.8 C), temperature source Oral, resp. rate 16, height  (1.651 m), weight 65.046 kg (143 lb 6.4 oz), last menstrual period 02/03/2016, SpO2 100 %.   Intake/Output Summary (Last 24 hours) at 02/12/16 1438 Last data filed at 02/12/16 1237  Gross per 24 hour  Intake 5225.41 ml  Output    300 ml  Net 4925.41 ml    Exam Awake Alert, Oriented x 3, No new F.N deficits, Normal affect [place].AT,PERRAL Supple Neck,No JVD, No cervical lymphadenopathy appriciated.  Symmetrical Chest wall movement, Good air movement bilaterally, CTAB RRR,No Gallops,Rubs or new Murmurs, No Parasternal Heave +ve B.Sounds, Abd Soft, Non tender, No organomegaly appriciated, No rebound -guarding or rigidity. No Cyanosis, Clubbing or  edema, No new Rash or bruise   Data Review   CBC w Diff:  Lab Results  Component Value Date   WBC 6.6 02/12/2016   HGB 9.3* 02/12/2016   HCT 29.8* 02/12/2016   PLT 209 02/12/2016   LYMPHOPCT 27 02/10/2016   MONOPCT 14 02/10/2016   EOSPCT 0 02/10/2016  BASOPCT 0 02/10/2016    CMP:  Lab Results  Component Value Date   NA 140 02/12/2016   K 3.6 02/12/2016   CL 112* 02/12/2016   CO2 21* 02/12/2016   BUN 10 02/12/2016   CREATININE 0.40* 02/12/2016   PROT 5.8* 02/12/2016   ALBUMIN 2.8* 02/12/2016   BILITOT 0.9 02/12/2016   ALKPHOS 49 02/12/2016   AST 53* 02/12/2016   ALT 76* 02/12/2016  .   Total Time in preparing paper work, data evaluation and todays exam - 35 minutes  Leroy SeaSINGH,Jalene Lacko K M.D on 02/12/2016 at 2:38 PM  [facility]   Office  [phone]

## 2016-02-16 DIAGNOSIS — R5383 Other fatigue: Secondary | ICD-10-CM | POA: Diagnosis not present

## 2016-02-16 DIAGNOSIS — D649 Anemia, unspecified: Secondary | ICD-10-CM | POA: Diagnosis not present

## 2016-02-16 DIAGNOSIS — R112 Nausea with vomiting, unspecified: Secondary | ICD-10-CM | POA: Diagnosis not present

## 2016-02-16 DIAGNOSIS — N390 Urinary tract infection, site not specified: Secondary | ICD-10-CM | POA: Diagnosis not present

## 2016-02-20 DIAGNOSIS — R799 Abnormal finding of blood chemistry, unspecified: Secondary | ICD-10-CM | POA: Diagnosis not present

## 2016-04-12 DIAGNOSIS — E040 Nontoxic diffuse goiter: Secondary | ICD-10-CM | POA: Insufficient documentation

## 2016-04-12 DIAGNOSIS — E0590 Thyrotoxicosis, unspecified without thyrotoxic crisis or storm: Secondary | ICD-10-CM | POA: Insufficient documentation

## 2016-04-12 DIAGNOSIS — E049 Nontoxic goiter, unspecified: Secondary | ICD-10-CM | POA: Diagnosis not present

## 2016-04-18 DIAGNOSIS — E042 Nontoxic multinodular goiter: Secondary | ICD-10-CM | POA: Diagnosis not present

## 2016-04-18 DIAGNOSIS — E049 Nontoxic goiter, unspecified: Secondary | ICD-10-CM | POA: Diagnosis not present

## 2016-10-12 DIAGNOSIS — E0590 Thyrotoxicosis, unspecified without thyrotoxic crisis or storm: Secondary | ICD-10-CM | POA: Diagnosis not present

## 2016-12-12 DIAGNOSIS — E0590 Thyrotoxicosis, unspecified without thyrotoxic crisis or storm: Secondary | ICD-10-CM | POA: Diagnosis not present

## 2017-06-04 DIAGNOSIS — N841 Polyp of cervix uteri: Secondary | ICD-10-CM | POA: Diagnosis not present

## 2017-06-04 DIAGNOSIS — Z6827 Body mass index (BMI) 27.0-27.9, adult: Secondary | ICD-10-CM | POA: Diagnosis not present

## 2017-06-04 DIAGNOSIS — D649 Anemia, unspecified: Secondary | ICD-10-CM | POA: Diagnosis not present

## 2017-06-04 DIAGNOSIS — E0590 Thyrotoxicosis, unspecified without thyrotoxic crisis or storm: Secondary | ICD-10-CM | POA: Diagnosis not present

## 2017-06-04 DIAGNOSIS — Z01419 Encounter for gynecological examination (general) (routine) without abnormal findings: Secondary | ICD-10-CM | POA: Diagnosis not present

## 2017-11-13 DIAGNOSIS — I10 Essential (primary) hypertension: Secondary | ICD-10-CM | POA: Diagnosis not present

## 2017-11-13 DIAGNOSIS — D649 Anemia, unspecified: Secondary | ICD-10-CM | POA: Diagnosis not present

## 2017-11-13 DIAGNOSIS — E039 Hypothyroidism, unspecified: Secondary | ICD-10-CM | POA: Diagnosis not present

## 2017-11-13 DIAGNOSIS — Z6825 Body mass index (BMI) 25.0-25.9, adult: Secondary | ICD-10-CM | POA: Diagnosis not present

## 2017-11-21 DIAGNOSIS — Z3009 Encounter for other general counseling and advice on contraception: Secondary | ICD-10-CM | POA: Diagnosis not present

## 2017-11-27 DIAGNOSIS — Z30430 Encounter for insertion of intrauterine contraceptive device: Secondary | ICD-10-CM | POA: Diagnosis not present

## 2017-11-27 DIAGNOSIS — Z3202 Encounter for pregnancy test, result negative: Secondary | ICD-10-CM | POA: Diagnosis not present

## 2017-11-27 DIAGNOSIS — Z30431 Encounter for routine checking of intrauterine contraceptive device: Secondary | ICD-10-CM | POA: Diagnosis not present

## 2017-12-11 DIAGNOSIS — D259 Leiomyoma of uterus, unspecified: Secondary | ICD-10-CM | POA: Diagnosis not present

## 2017-12-11 DIAGNOSIS — I10 Essential (primary) hypertension: Secondary | ICD-10-CM | POA: Diagnosis not present

## 2017-12-11 DIAGNOSIS — D569 Thalassemia, unspecified: Secondary | ICD-10-CM | POA: Diagnosis not present

## 2017-12-11 DIAGNOSIS — N946 Dysmenorrhea, unspecified: Secondary | ICD-10-CM | POA: Diagnosis not present

## 2018-01-14 DIAGNOSIS — Z30431 Encounter for routine checking of intrauterine contraceptive device: Secondary | ICD-10-CM | POA: Diagnosis not present

## 2018-03-03 DIAGNOSIS — Z114 Encounter for screening for human immunodeficiency virus [HIV]: Secondary | ICD-10-CM | POA: Diagnosis not present

## 2018-03-03 DIAGNOSIS — Z13220 Encounter for screening for lipoid disorders: Secondary | ICD-10-CM | POA: Diagnosis not present

## 2018-03-03 DIAGNOSIS — Z1329 Encounter for screening for other suspected endocrine disorder: Secondary | ICD-10-CM | POA: Diagnosis not present

## 2018-03-03 DIAGNOSIS — Z0000 Encounter for general adult medical examination without abnormal findings: Secondary | ICD-10-CM | POA: Diagnosis not present

## 2018-03-05 ENCOUNTER — Other Ambulatory Visit: Payer: Self-pay | Admitting: Family Medicine

## 2018-03-05 DIAGNOSIS — Z6826 Body mass index (BMI) 26.0-26.9, adult: Secondary | ICD-10-CM | POA: Diagnosis not present

## 2018-03-05 DIAGNOSIS — Z1231 Encounter for screening mammogram for malignant neoplasm of breast: Secondary | ICD-10-CM

## 2018-03-05 DIAGNOSIS — Z23 Encounter for immunization: Secondary | ICD-10-CM | POA: Diagnosis not present

## 2018-03-05 DIAGNOSIS — Z0000 Encounter for general adult medical examination without abnormal findings: Secondary | ICD-10-CM | POA: Diagnosis not present

## 2018-03-05 DIAGNOSIS — I10 Essential (primary) hypertension: Secondary | ICD-10-CM | POA: Diagnosis not present

## 2018-03-07 ENCOUNTER — Ambulatory Visit
Admission: RE | Admit: 2018-03-07 | Discharge: 2018-03-07 | Disposition: A | Discharge status: Home / Self Care | Payer: BLUE CROSS/BLUE SHIELD | Source: Ambulatory Visit | Attending: Family Medicine | Admitting: Family Medicine

## 2018-03-07 DIAGNOSIS — Z1231 Encounter for screening mammogram for malignant neoplasm of breast: Secondary | ICD-10-CM | POA: Diagnosis not present

## 2018-04-02 DIAGNOSIS — Z23 Encounter for immunization: Secondary | ICD-10-CM | POA: Diagnosis not present

## 2018-08-14 DIAGNOSIS — D649 Anemia, unspecified: Secondary | ICD-10-CM | POA: Insufficient documentation

## 2018-08-14 DIAGNOSIS — D259 Leiomyoma of uterus, unspecified: Secondary | ICD-10-CM | POA: Insufficient documentation

## 2018-09-05 ENCOUNTER — Other Ambulatory Visit (HOSPITAL_COMMUNITY): Payer: Self-pay | Admitting: Obstetrics & Gynecology

## 2018-09-05 DIAGNOSIS — Z975 Presence of (intrauterine) contraceptive device: Secondary | ICD-10-CM

## 2018-09-11 ENCOUNTER — Ambulatory Visit (HOSPITAL_COMMUNITY)
Admission: RE | Admit: 2018-09-11 | Discharge: 2018-09-11 | Disposition: A | Discharge status: Home / Self Care | Payer: PRIVATE HEALTH INSURANCE | Source: Ambulatory Visit | Attending: Obstetrics & Gynecology | Admitting: Obstetrics & Gynecology

## 2018-09-11 DIAGNOSIS — Z975 Presence of (intrauterine) contraceptive device: Secondary | ICD-10-CM

## 2019-03-04 DIAGNOSIS — K011 Impacted teeth: Secondary | ICD-10-CM | POA: Insufficient documentation

## 2019-03-04 DIAGNOSIS — R6884 Jaw pain: Secondary | ICD-10-CM | POA: Insufficient documentation

## 2019-10-02 ENCOUNTER — Ambulatory Visit: Payer: PRIVATE HEALTH INSURANCE | Attending: Internal Medicine

## 2019-10-02 DIAGNOSIS — Z23 Encounter for immunization: Secondary | ICD-10-CM | POA: Insufficient documentation

## 2019-10-02 NOTE — Progress Notes (Signed)
   Covid-19 Vaccination Clinic  Name:  Karilyn Wind    MRN: [id number] DOB: [birth year]  10/02/2019  Ms. Nauman was observed post Covid-19 immunization for 15 minutes without incident. She was provided with Vaccine Information Sheet and instruction to access the V-Safe system.   Ms. Bracken was instructed to call 911 with any severe reactions post vaccine: Marland Kitchen Difficulty breathing  . Swelling of face and throat  . A fast heartbeat  . A bad rash all over body  . Dizziness and weakness   Immunizations Administered    Name Date Dose VIS Date Route   Pfizer COVID-19 Vaccine 10/02/2019 10:53 AM 0.3 mL 07/10/2019 Intramuscular   Manufacturer: [employer], [employer]   Lot: [id number]   NDC: [address]-2

## 2019-10-28 ENCOUNTER — Ambulatory Visit: Payer: PRIVATE HEALTH INSURANCE | Attending: Internal Medicine

## 2019-10-28 DIAGNOSIS — Z23 Encounter for immunization: Secondary | ICD-10-CM

## 2019-10-28 NOTE — Progress Notes (Signed)
   Covid-19 Vaccination Clinic  Name:  Beth Ruiz    MRN: [id number] DOB: 1976/06/03  10/28/2019  [name] was observed post Covid-19 immunization for 15 minutes without incident. She was provided with Vaccine Information Sheet and instruction to access the V-Safe system.   [name] was instructed to call 911 with any severe reactions post vaccine: Marland Kitchen Difficulty breathing  . Swelling of face and throat  . A fast heartbeat  . A bad rash all over body  . Dizziness and weakness   Immunizations Administered    Name Date Dose VIS Date Route   Pfizer COVID-19 Vaccine 10/28/2019  4:12 PM 0.3 mL 07/10/2019 Intramuscular   Manufacturer: [employer], [employer]   Lot: [id number]   NDC: [address]-8

## 2020-02-03 IMAGING — MG DIGITAL SCREENING BILATERAL MAMMOGRAM WITH CAD
1 series · 1 of 1 positions shown · non-contrast
Comparison: None.

CLINICAL DATA: Screening.

EXAM:
DIGITAL SCREENING BILATERAL MAMMOGRAM WITH CAD

[L MLO]
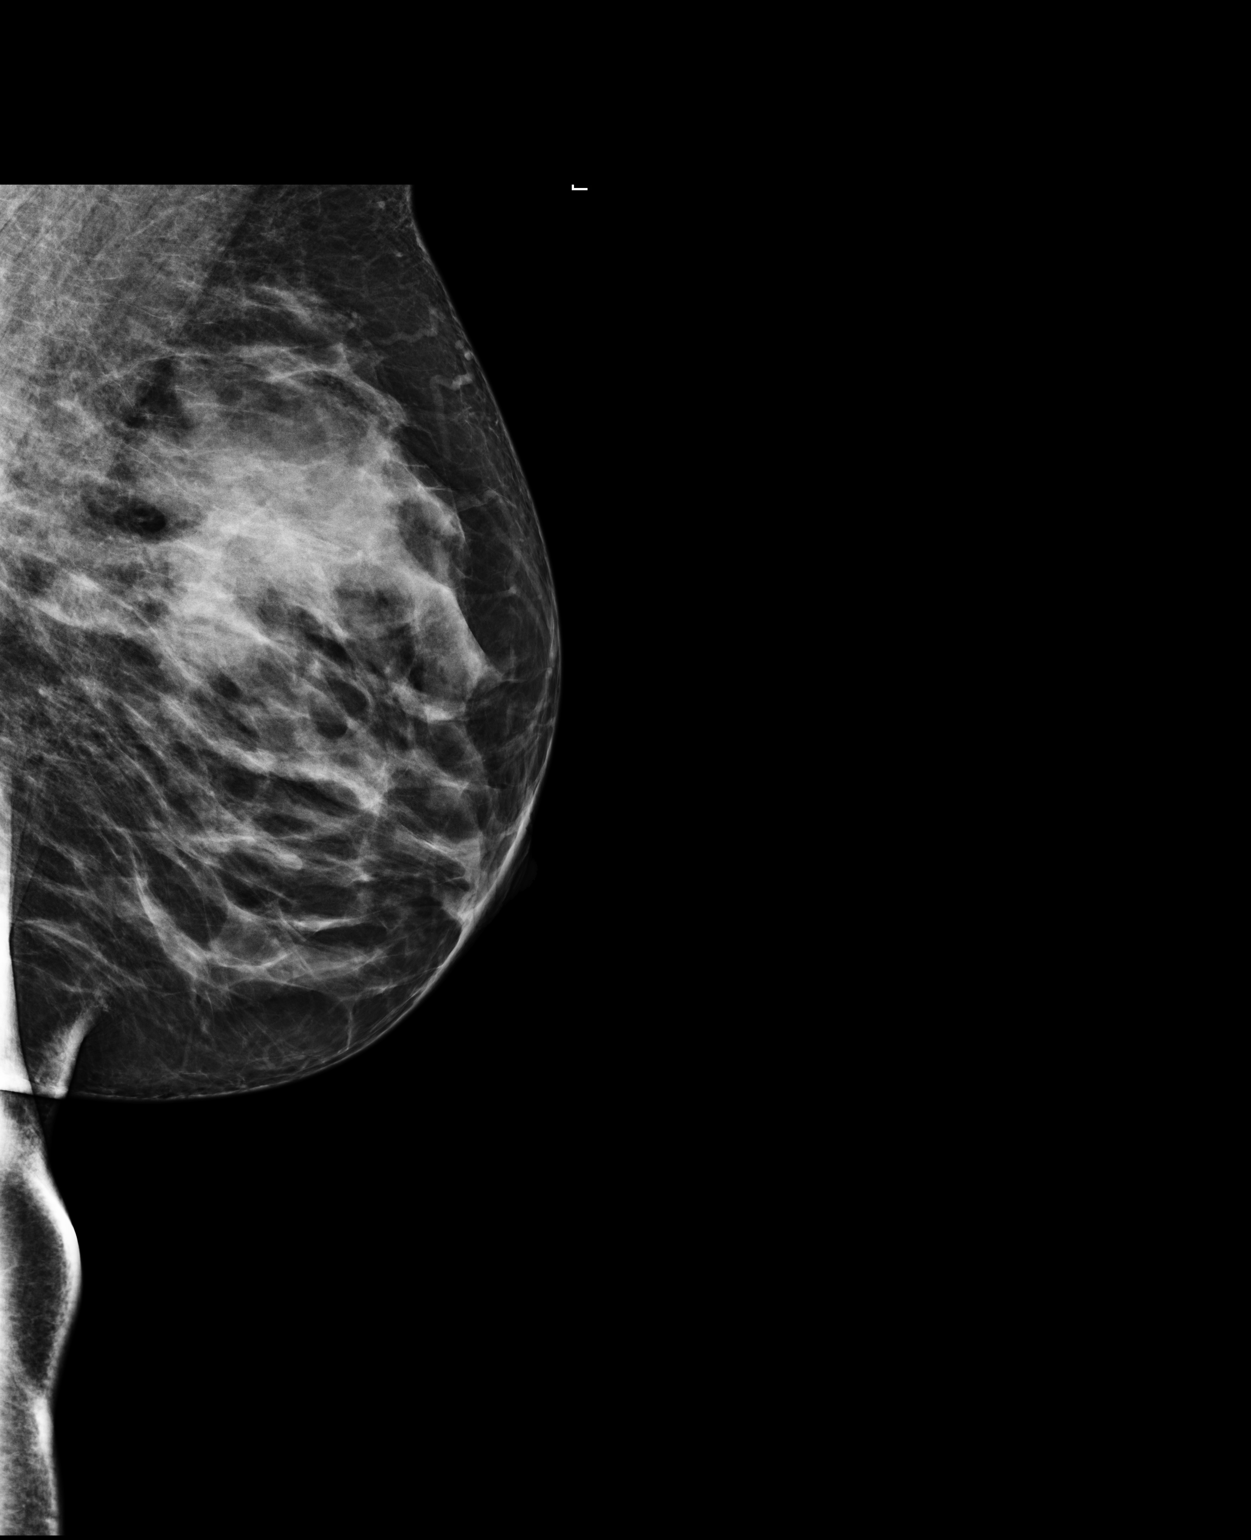

[1 of 1 positions shown; findings below may reference images not displayed]

ACR Breast Density Category c: The breast tissue is heterogeneously
dense, which may obscure small masses
FINDINGS: There are no findings suspicious for malignancy. Images were
processed with CAD.
IMPRESSION: No mammographic evidence of malignancy. A result letter of this
screening mammogram will be mailed directly to the patient.

RECOMMENDATION:
Screening mammogram in one year. (Code:U2-0-761)

BI-RADS CATEGORY  1: Negative.

## 2020-09-13 ENCOUNTER — Other Ambulatory Visit: Payer: Self-pay | Admitting: Family Medicine

## 2020-09-13 DIAGNOSIS — Z1231 Encounter for screening mammogram for malignant neoplasm of breast: Secondary | ICD-10-CM

## 2020-12-05 ENCOUNTER — Ambulatory Visit
Admission: RE | Admit: 2020-12-05 | Discharge: 2020-12-05 | Disposition: A | Discharge status: Home / Self Care | Payer: PRIVATE HEALTH INSURANCE | Source: Ambulatory Visit | Attending: Family Medicine | Admitting: Family Medicine

## 2020-12-05 ENCOUNTER — Other Ambulatory Visit: Payer: Self-pay

## 2020-12-05 DIAGNOSIS — Z1231 Encounter for screening mammogram for malignant neoplasm of breast: Secondary | ICD-10-CM

## 2022-03-06 DIAGNOSIS — Z01419 Encounter for gynecological examination (general) (routine) without abnormal findings: Secondary | ICD-10-CM | POA: Diagnosis not present

## 2022-03-06 DIAGNOSIS — Z124 Encounter for screening for malignant neoplasm of cervix: Secondary | ICD-10-CM | POA: Diagnosis not present

## 2022-03-06 DIAGNOSIS — Z6826 Body mass index (BMI) 26.0-26.9, adult: Secondary | ICD-10-CM | POA: Diagnosis not present

## 2022-03-09 ENCOUNTER — Other Ambulatory Visit: Payer: Self-pay | Admitting: Family Medicine

## 2022-03-09 DIAGNOSIS — Z1231 Encounter for screening mammogram for malignant neoplasm of breast: Secondary | ICD-10-CM

## 2022-03-12 DIAGNOSIS — L910 Hypertrophic scar: Secondary | ICD-10-CM | POA: Diagnosis not present

## 2022-03-15 ENCOUNTER — Encounter: Payer: Self-pay | Admitting: Gastroenterology

## 2022-04-03 DIAGNOSIS — E039 Hypothyroidism, unspecified: Secondary | ICD-10-CM | POA: Diagnosis not present

## 2022-04-03 DIAGNOSIS — R7301 Impaired fasting glucose: Secondary | ICD-10-CM | POA: Diagnosis not present

## 2022-04-03 DIAGNOSIS — E782 Mixed hyperlipidemia: Secondary | ICD-10-CM | POA: Diagnosis not present

## 2022-04-04 ENCOUNTER — Ambulatory Visit
Admission: RE | Admit: 2022-04-04 | Discharge: 2022-04-04 | Disposition: A | Discharge status: Home / Self Care | Payer: BC Managed Care – PPO | Source: Ambulatory Visit | Attending: Family Medicine | Admitting: Family Medicine

## 2022-04-04 DIAGNOSIS — Z1231 Encounter for screening mammogram for malignant neoplasm of breast: Secondary | ICD-10-CM

## 2022-04-05 DIAGNOSIS — I10 Essential (primary) hypertension: Secondary | ICD-10-CM | POA: Diagnosis not present

## 2022-04-05 DIAGNOSIS — R7301 Impaired fasting glucose: Secondary | ICD-10-CM | POA: Diagnosis not present

## 2022-04-05 DIAGNOSIS — E782 Mixed hyperlipidemia: Secondary | ICD-10-CM | POA: Diagnosis not present

## 2022-04-05 DIAGNOSIS — Z8639 Personal history of other endocrine, nutritional and metabolic disease: Secondary | ICD-10-CM | POA: Diagnosis not present

## 2022-05-01 ENCOUNTER — Encounter: Payer: Self-pay | Admitting: Gastroenterology

## 2022-05-01 ENCOUNTER — Ambulatory Visit: Payer: BC Managed Care – PPO | Admitting: Gastroenterology

## 2022-05-01 VITALS — BP 110/70 | HR 92

## 2022-05-01 DIAGNOSIS — Z800 Family history of malignant neoplasm of digestive organs: Secondary | ICD-10-CM

## 2022-05-01 DIAGNOSIS — Z1211 Encounter for screening for malignant neoplasm of colon: Secondary | ICD-10-CM

## 2022-05-01 NOTE — Patient Instructions (Signed)
_______________________________________________________  If you are age 44 or older, your body mass index should be between 23-30. Your There is no height or weight on file to calculate BMI. If this is out of the aforementioned range listed, please consider follow up with your Primary Care Provider.  If you are age 63 or younger, your body mass index should be between 19-25. Your There is no height or weight on file to calculate BMI. If this is out of the aformentioned range listed, please consider follow up with your Primary Care Provider.   ________________________________________________________  The [facility] providers would like to encourage you to use [facility] to communicate with providers for non-urgent requests or questions.  Due to long hold times on the telephone, sending your provider a message by [facility] may be a faster and more efficient way to get a response.  Please allow 48 business hours for a response.  Please remember that this is for non-urgent requests.  _______________________________________________________  Dennis Bast will be due for a recall colonoscopy in July 2024. We will send you a reminder in the mail when it gets closer to that time.  It was a pleasure to see you today!  Thank you for trusting me with your gastrointestinal care!

## 2022-05-01 NOTE — Progress Notes (Signed)
[facility] Consult Note:  History: Beth Ruiz 05/01/2022  Referring provider: Einar Grad, NP  Reason for consult/chief complaint: Colonoscopy (Discuss having family Hx of colon cancer), Hemorrhoids, and Constipation (Last BM last night)   Subjective  HPI: Beth Ruiz wanted to see [place] to discuss concerns about colorectal cancer screening regarding her family history.  Her maternal grandmother had colorectal cancer, and there are no additional family members with colorectal cancer.  No family members on that same side with uterine or ovarian cancer. Despite above notation by Beth Ruiz CMA, Beth Ruiz says she has no abdominal pain, her bowel habits are regular without rectal bleeding.  She denies nausea, vomiting dysphagia loss of appetite or weight loss.  Occasional heartburn that she does not consider to be a problem.   ROS:  Review of Systems She denies chest pain dyspnea or dysuria  Past Medical History: Past Medical History:  Diagnosis Date   Hypertension      Past Surgical History: History reviewed. No pertinent surgical history.   Family History: Family History  Problem Relation Age of Onset   Hypertension Mother    Hypertension Father    Colon cancer Maternal Grandmother    Diabetes Maternal Grandmother    Hypertension Maternal Grandmother    Uterine cancer Paternal Aunt     Social History: Social History   Socioeconomic History   Marital status: Single    Spouse name: Not on file   Number of children: Not on file   Years of education: Not on file   Highest education level: Not on file  Occupational History   Not on file  Tobacco Use   Smoking status: Never   Smokeless tobacco: Not on file  Vaping Use   Vaping Use: Never used  Substance and Sexual Activity   Alcohol use: No   Drug use: No   Sexual activity: Never    Birth control/protection: None  Other Topics Concern   Not on file  Social History Narrative   Not on file    Social Determinants of Health   Financial Resource Strain: Not on file  Food Insecurity: Not on file  Transportation Needs: Not on file  Physical Activity: Not on file  Stress: Not on file  Social Connections: Not on file   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 60 tablet 0   olmesartan (BENICAR) 20 MG tablet Take 20 mg by mouth daily.     No current facility-administered medications for this visit.      ___________________________________________________________________ Objective   Exam:  BP 110/70   Pulse 92  Wt Readings from Last 3 Encounters:  02/11/16 143 lb 6.4 oz (65 kg)    Well-appearing.  Ambulatory, pleasant and conversational No additional exam, entire visit spent in discussion.  Labs:  No primary care records for review, no data  Assessment: Encounter Diagnosis  Name Primary?   Family history of colon cancer Yes    Single second-degree relative with colorectal cancer, this still puts Beth Ruiz an average risk.  Therefore, she should have a screening colonoscopy at age 51. We discussed the data and rationale for this with the current guidelines and she was reassured.  We put her on our colonoscopy recall database so we will contact her  next summer Thank you for the courtesy of this consult.  Please call me with any questions or concerns.  Nelida Meuse III  CC: Rachell Cipro, MD

## 2022-06-06 DIAGNOSIS — Z7185 Encounter for immunization safety counseling: Secondary | ICD-10-CM | POA: Diagnosis not present

## 2022-06-06 DIAGNOSIS — M19049 Primary osteoarthritis, unspecified hand: Secondary | ICD-10-CM | POA: Diagnosis not present

## 2022-06-06 DIAGNOSIS — L659 Nonscarring hair loss, unspecified: Secondary | ICD-10-CM | POA: Diagnosis not present

## 2022-06-06 DIAGNOSIS — I10 Essential (primary) hypertension: Secondary | ICD-10-CM | POA: Diagnosis not present

## 2022-06-12 DIAGNOSIS — L910 Hypertrophic scar: Secondary | ICD-10-CM | POA: Diagnosis not present

## 2022-08-14 DIAGNOSIS — L910 Hypertrophic scar: Secondary | ICD-10-CM | POA: Diagnosis not present

## 2022-09-10 DIAGNOSIS — I10 Essential (primary) hypertension: Secondary | ICD-10-CM | POA: Diagnosis not present

## 2022-09-10 DIAGNOSIS — R5383 Other fatigue: Secondary | ICD-10-CM | POA: Diagnosis not present

## 2022-09-10 DIAGNOSIS — M19049 Primary osteoarthritis, unspecified hand: Secondary | ICD-10-CM | POA: Diagnosis not present

## 2022-09-10 DIAGNOSIS — R002 Palpitations: Secondary | ICD-10-CM | POA: Diagnosis not present

## 2022-09-10 DIAGNOSIS — L659 Nonscarring hair loss, unspecified: Secondary | ICD-10-CM | POA: Diagnosis not present

## 2022-10-08 DIAGNOSIS — F411 Generalized anxiety disorder: Secondary | ICD-10-CM | POA: Diagnosis not present

## 2022-10-08 DIAGNOSIS — L659 Nonscarring hair loss, unspecified: Secondary | ICD-10-CM | POA: Diagnosis not present

## 2022-10-08 DIAGNOSIS — F439 Reaction to severe stress, unspecified: Secondary | ICD-10-CM | POA: Diagnosis not present

## 2022-10-08 DIAGNOSIS — I10 Essential (primary) hypertension: Secondary | ICD-10-CM | POA: Diagnosis not present

## 2022-10-19 ENCOUNTER — Other Ambulatory Visit: Payer: Self-pay | Admitting: Neurological Surgery

## 2022-10-19 DIAGNOSIS — G959 Disease of spinal cord, unspecified: Secondary | ICD-10-CM

## 2022-11-03 IMAGING — MG MM DIGITAL SCREENING BILAT W/ TOMO AND CAD
8 series · 9 of 24 positions shown · non-contrast
Comparison: Previous exam(s).

CLINICAL DATA: Screening.

EXAM:
DIGITAL SCREENING BILATERAL MAMMOGRAM WITH TOMOSYNTHESIS AND CAD
TECHNIQUE: Bilateral screening digital craniocaudal and mediolateral oblique
mammograms were obtained. Bilateral screening digital breast
tomosynthesis was performed. The images were evaluated with
computer-aided detection.

[L CC synth-2D]
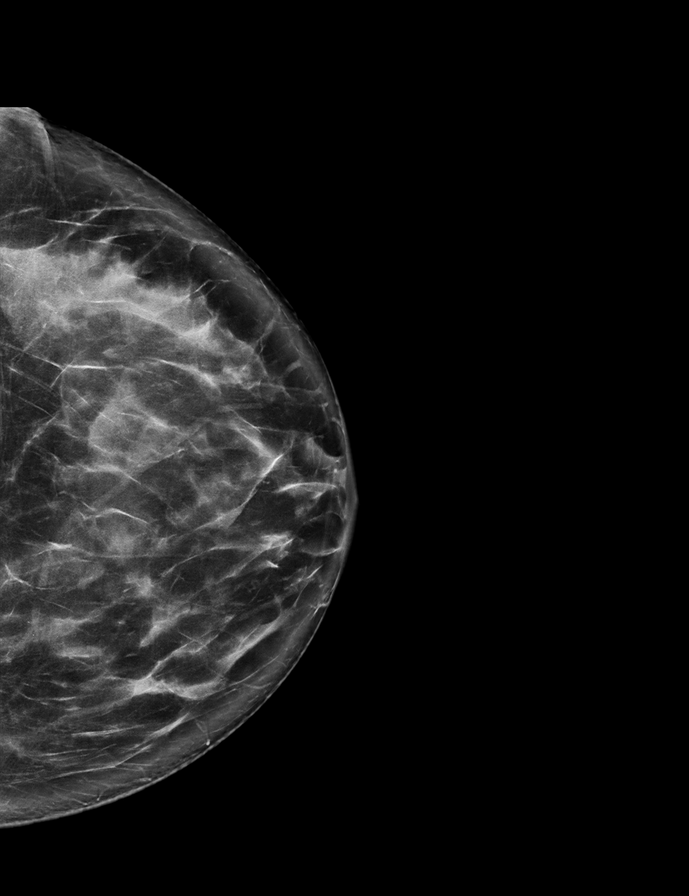

[R MLO synth-2D]
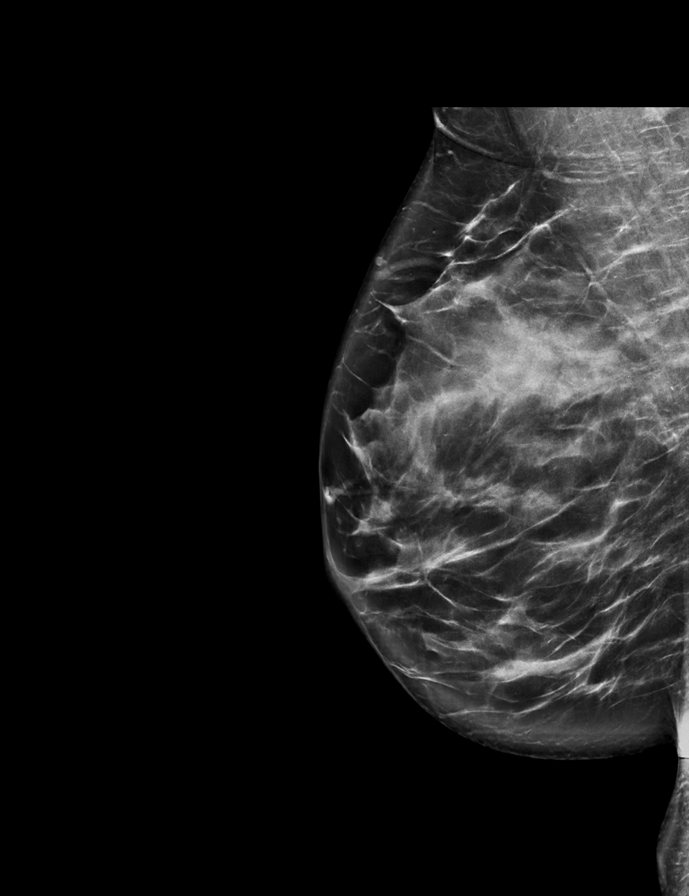

[R CC synth-2D]
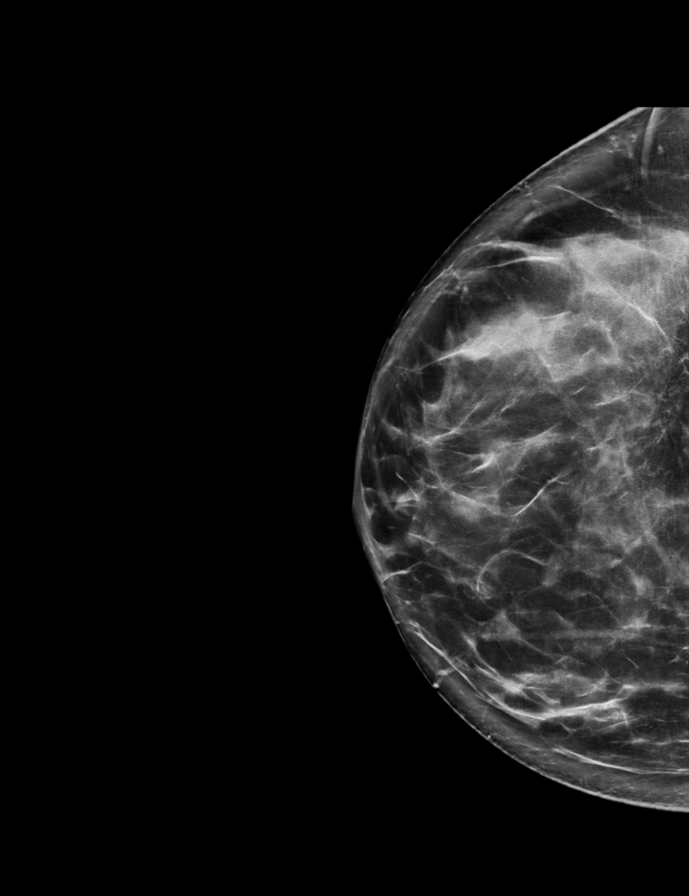

[L MLO synth-2D]
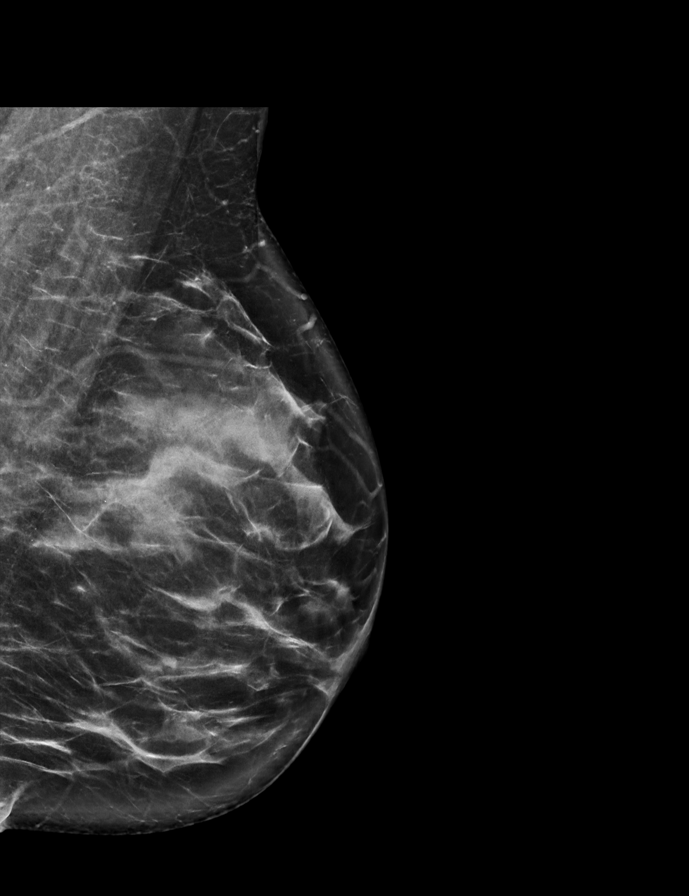

[L MLO tomo · 2 of 81 frames shown]
[frame 27/81]
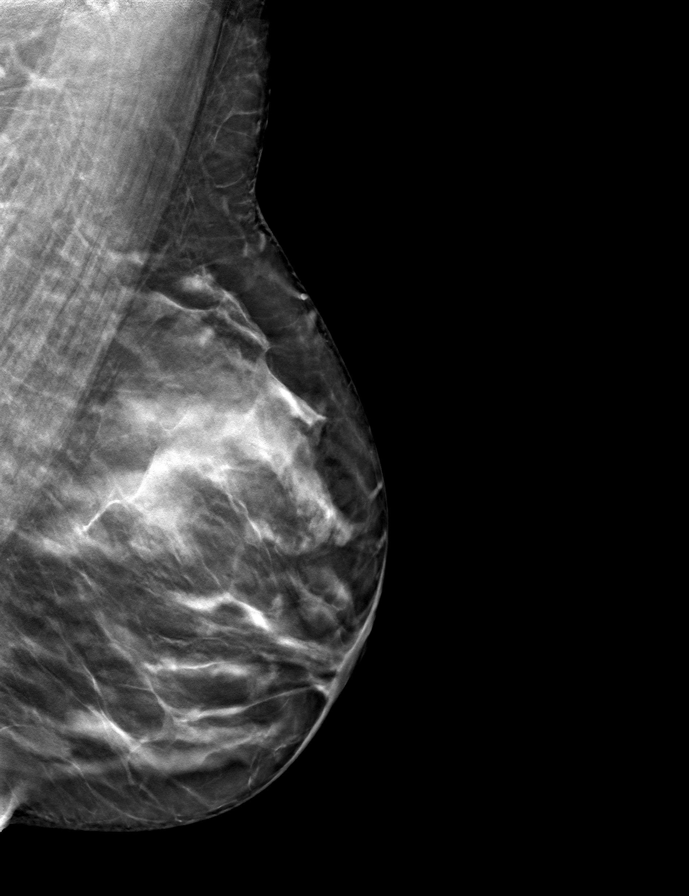
[frame 41/81]
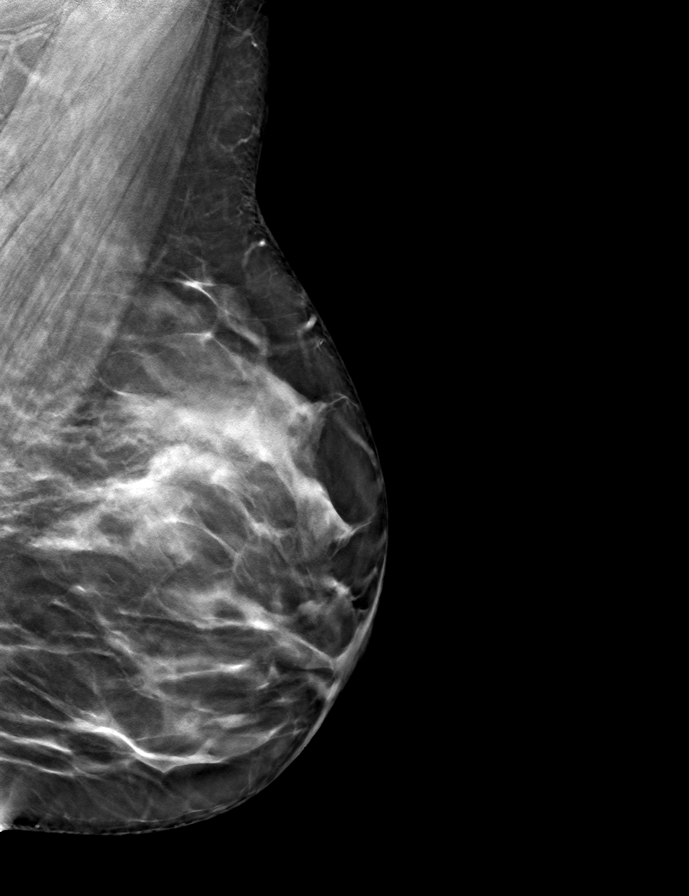

[R MLO tomo · tomo slice 43/86.0]
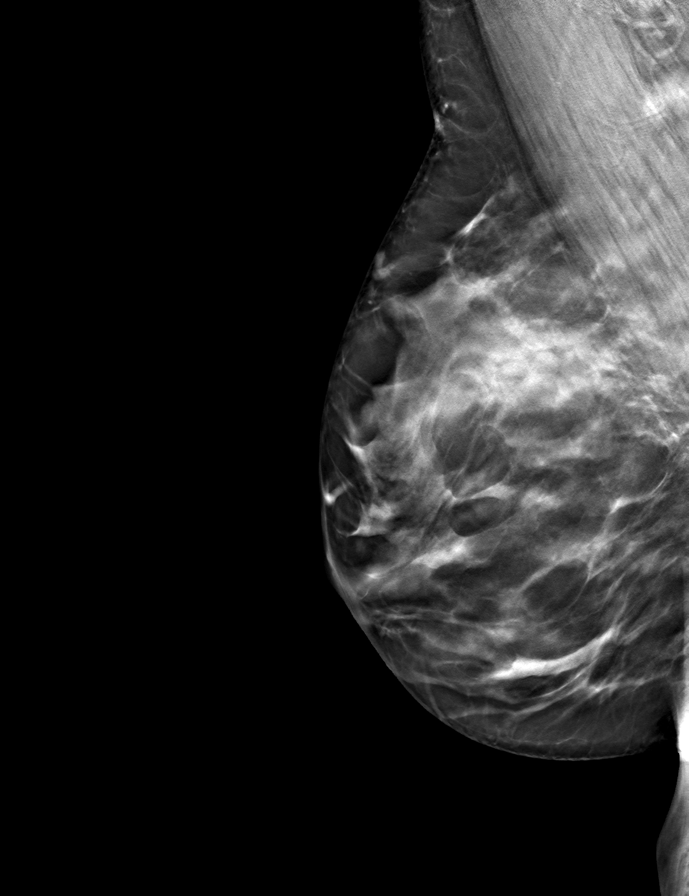

[R CC tomo · tomo slice 39/77.0]
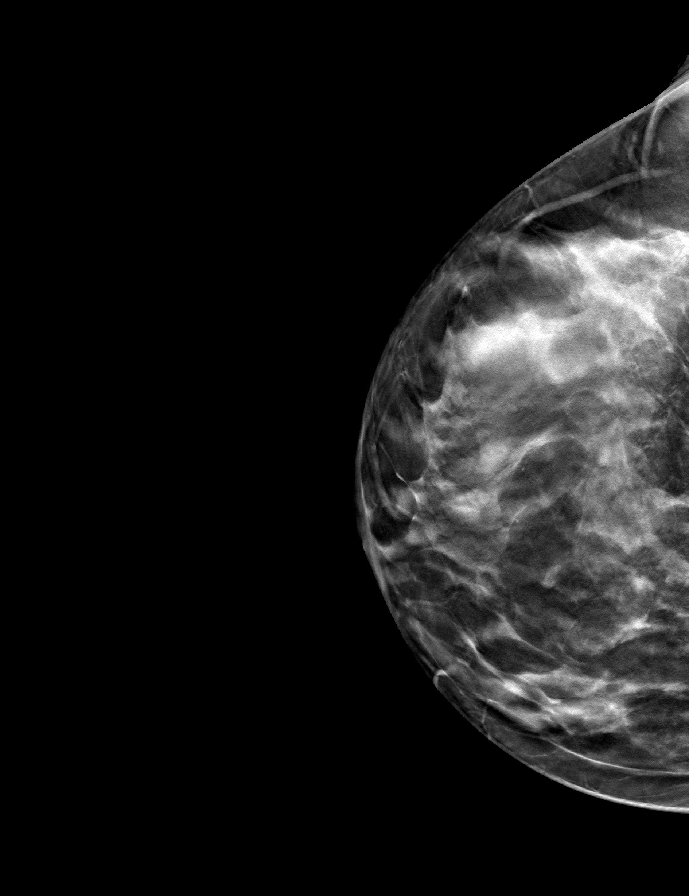

[L CC tomo · tomo slice 40/79.0]
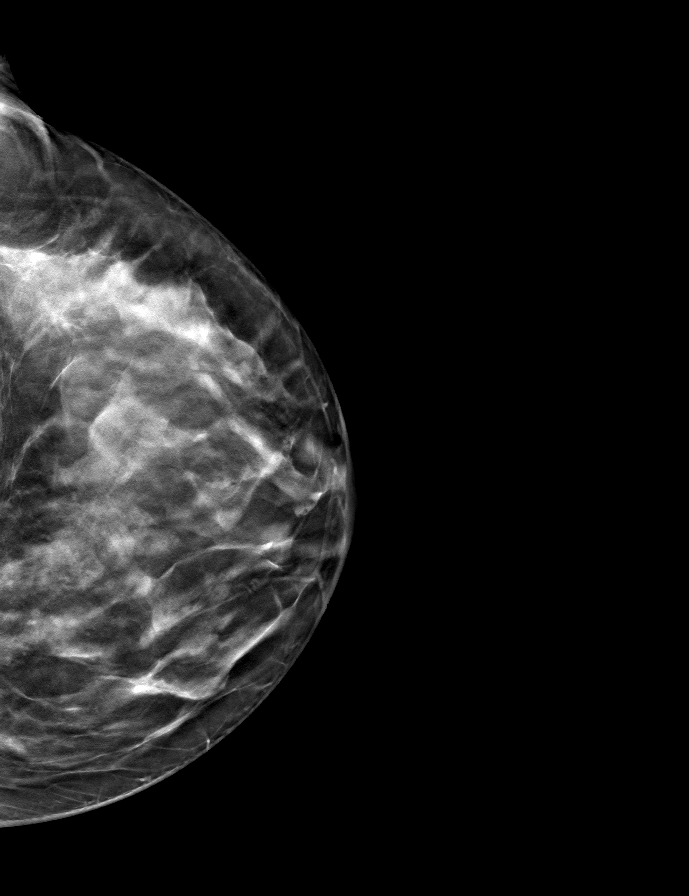

[9 of 24 positions shown; findings below may reference images not displayed]

ACR Breast Density Category c: The breast tissue is heterogeneously
dense, which may obscure small masses.
FINDINGS: There are no findings suspicious for malignancy. The images were
evaluated with computer-aided detection.
IMPRESSION: No mammographic evidence of malignancy. A result letter of this
screening mammogram will be mailed directly to the patient.

RECOMMENDATION:
Screening mammogram in one year. (Code:T4-5-GWO)

BI-RADS CATEGORY  1: Negative.

## 2022-11-21 ENCOUNTER — Other Ambulatory Visit: Payer: Self-pay | Admitting: Surgery

## 2022-11-21 DIAGNOSIS — M4716 Other spondylosis with myelopathy, lumbar region: Secondary | ICD-10-CM

## 2022-11-29 DIAGNOSIS — L910 Hypertrophic scar: Secondary | ICD-10-CM | POA: Diagnosis not present

## 2022-12-03 ENCOUNTER — Encounter: Payer: Self-pay | Admitting: Gastroenterology

## 2023-01-16 DIAGNOSIS — L910 Hypertrophic scar: Secondary | ICD-10-CM | POA: Diagnosis not present

## 2023-03-06 ENCOUNTER — Encounter: Payer: Self-pay | Admitting: Gastroenterology

## 2023-03-27 DIAGNOSIS — Z01419 Encounter for gynecological examination (general) (routine) without abnormal findings: Secondary | ICD-10-CM | POA: Diagnosis not present

## 2023-03-27 DIAGNOSIS — Z6828 Body mass index (BMI) 28.0-28.9, adult: Secondary | ICD-10-CM | POA: Diagnosis not present

## 2023-04-04 ENCOUNTER — Encounter: Payer: Self-pay | Admitting: Gastroenterology

## 2023-04-04 ENCOUNTER — Ambulatory Visit (AMBULATORY_SURGERY_CENTER): Payer: BC Managed Care – PPO | Admitting: *Deleted

## 2023-04-04 VITALS — Ht 65.0 in | Wt 168.0 lb

## 2023-04-04 DIAGNOSIS — Z800 Family history of malignant neoplasm of digestive organs: Secondary | ICD-10-CM

## 2023-04-04 DIAGNOSIS — Z1211 Encounter for screening for malignant neoplasm of colon: Secondary | ICD-10-CM

## 2023-04-04 MED ORDER — NA SULFATE-K SULFATE-MG SULF 17.5-3.13-1.6 GM/177ML PO SOLN: 1.0000 | Freq: Once | ORAL | 0 refills | Status: AC

## 2023-04-04 NOTE — Progress Notes (Signed)
Pt's name and DOB verified at the beginning of the pre-visit.  Pt denies any difficulty with ambulating,sitting, laying down or rolling side to side Gave both [facility] main # and MD on call # prior to instructions.  No egg or soy allergy known to patient  No issues known to pt with past sedation with any surgeries or procedures Pt has no issues moving head neck or swallowing No FH of Malignant Hyperthermia Pt is not on diet pills Pt is not on home 02  Pt is not on blood thinners  Pt denies issues with constipation  Pt is not on dialysis Pt denise any abnormal heart rhythms  Pt denies any upcoming cardiac testing Pt encouraged to use to use Singlecare or Goodrx to reduce cost  Patient's chart reviewed by Cathlyn Parsons CNRA prior to pre-visit and patient appropriate for the [facility].  Pre-visit completed and red dot placed by patient's name on their procedure day (on provider's schedule).  . Visit in person Pt scale weight is 168 lb Instructed pt why it is important to and  to call if they have any changes in health or new medications. Directed them to the # given and on instructions.   Pt states they will.  Instructions reviewed with pt and pt states understanding. Instructed to review again prior to procedure. Pt states they will.  Instructions given to pt coupon and by my chart

## 2023-04-18 ENCOUNTER — Ambulatory Visit (AMBULATORY_SURGERY_CENTER): Payer: BC Managed Care – PPO | Admitting: Gastroenterology

## 2023-04-18 ENCOUNTER — Encounter: Payer: Self-pay | Admitting: Gastroenterology

## 2023-04-18 VITALS — BP 130/71 | HR 75 | Temp 98.0°F | Resp 13 | Ht 65.0 in | Wt 168.0 lb

## 2023-04-18 DIAGNOSIS — Z1211 Encounter for screening for malignant neoplasm of colon: Secondary | ICD-10-CM | POA: Diagnosis not present

## 2023-04-18 MED ORDER — SODIUM CHLORIDE 0.9 % IV SOLN: 500.0000 mL | Freq: Once | INTRAVENOUS | Status: DC

## 2023-04-18 MED ORDER — SODIUM CHLORIDE 0.9 % IV SOLN
4.0000 mg | Freq: Once | INTRAVENOUS | Status: AC
  Administered 2023-04-18: 4 mg via INTRAVENOUS

## 2023-04-18 NOTE — Patient Instructions (Addendum)
Patient has a contact number available for                            emergencies. The signs and symptoms of potential                            delayed complications were discussed with the                            patient. Return to normal activities tomorrow.                            Written discharge instructions were provided to the                            patient.                           - Resume previous diet.                           - Continue present medications.                           - Repeat colonoscopy in 10 years for screening                            purposes. Henry L. Danis, MD     YOU HAD AN ENDOSCOPIC PROCEDURE TODAY AT THE [facility]:   Refer to the procedure report that was given to you for any specific questions about what was found during the examination.  If the procedure report does not answer your questions, please call your gastroenterologist to clarify.  If you requested that your care partner not be given the details of your procedure findings, then the procedure report has been included in a sealed envelope for you to review at your convenience later.  YOU SHOULD EXPECT: Some feelings of bloating in the abdomen. Passage of more gas than usual.  Walking can help get rid of the air that was put into your GI tract during the procedure and reduce the bloating. If you had a lower endoscopy (such as a colonoscopy or flexible sigmoidoscopy) you may notice spotting of blood in your stool or on the toilet paper. If you underwent a bowel prep for your procedure, you may not have a normal bowel movement for a few days.  Please Note:  You might notice some irritation and congestion in your nose or some drainage.  This is from the oxygen used during your procedure.  There is no need for concern and it should clear up in a day or so.  SYMPTOMS TO REPORT IMMEDIATELY:  Following lower endoscopy (colonoscopy or flexible sigmoidoscopy):  Excessive  amounts of blood in the stool  Significant tenderness or worsening of abdominal pains  Swelling of the abdomen that is new, acute  Fever of 100F or higher   For urgent or emergent issues, a gastroenterologist can be reached at any hour by calling [phone]. Do not use MyChart messaging for urgent concerns.    DIET:  We do recommend a small meal  at first, but then you may proceed to your regular diet.  Drink plenty of fluids but you should avoid alcoholic beverages for 24 hours.  ACTIVITY:  You should plan to take it easy for the rest of today and you should NOT DRIVE or use heavy machinery until tomorrow (because of the sedation medicines used during the test).    FOLLOW UP: Our staff will call the number listed on your records the next business day following your procedure.  We will call around 7:15- 8:00 am to check on you and address any questions or concerns that you may have regarding the information given to you following your procedure. If we do not reach you, we will leave a message.     If any biopsies were taken you will be contacted by phone or by letter within the next 1-3 weeks.  Please call us at [phone] if you have not heard about the biopsies in 3 weeks.    SIGNATURES/CONFIDENTIALITY: You and/or your care partner have signed paperwork which will be entered into your electronic medical record.  These signatures attest to the fact that that the information above on your After Visit Summary has been reviewed and is understood.  Full responsibility of the confidentiality of this discharge information lies with you and/or your care-partner.

## 2023-04-18 NOTE — Progress Notes (Signed)
A/O x 3, gd SR's, VSS, report to RN

## 2023-04-18 NOTE — Progress Notes (Signed)
Pt's states no medical or surgical changes since previsit or office visit. 

## 2023-04-18 NOTE — Progress Notes (Signed)
History and Physical:  This patient presents for endoscopic testing for: Encounter Diagnosis  Name Primary?   Special screening for malignant neoplasms, colon Yes    Average risk for colorectal cancer.  First screening exam.  2023 October office consult note describes history of colon cancer in a grandmother.  Patient is otherwise without complaints or active issues today.   Past Medical History: Past Medical History:  Diagnosis Date   Anemia    Arthritis    Joints of hands   Graves disease    Hypertension    Thyroid disease    No meds gets testing every 6 months     Past Surgical History: Past Surgical History:  Procedure Laterality Date   LESION REMOVAL Right    Jaw   WISDOM TOOTH EXTRACTION      Allergies: No Known Allergies  Outpatient Meds: Current Outpatient Medications  Medication Sig Dispense Refill   Cholecalciferol (VITAMIN D3) 50 MCG (2000 UT) capsule      olmesartan (BENICAR) 20 MG tablet Take 20 mg by mouth daily.     clobetasol cream (TEMOVATE) 0.05 % APPLY TO AFFECTED AREA EVERY DAY     Cream Base (SCAR CARE EX) Apply Topically BID to affected areas for 30     diclofenac Sodium (VOLTAREN) 1 % GEL APPLY 2 GRAMS TOPICALLY TO AFFECTED AREA ON UPPER EXTREMITY 4 TIMES PER DAY     ferrous sulfate 325 (65 FE) MG tablet Take 1 tablet (325 mg total) by mouth 2 (two) times daily with a meal. 60 tablet 0   Current Facility-Administered Medications  Medication Dose Route Frequency Provider Last Rate Last Admin   0.9 %  sodium chloride infusion  500 mL Intravenous Once Sherrilyn Rist, MD          ___________________________________________________________________ Objective   Exam:  BP 135/78   Pulse (!) 113   Temp 98 F (36.7 C)   Ht 5\' 5"  (1.651 m)   Wt 168 lb (76.2 kg)   SpO2 100%   BMI 27.96 kg/m   CV: regular , S1/S2 Resp: clear to auscultation bilaterally, normal RR and effort noted GI: soft, no tenderness, with active bowel  sounds.   Assessment: Encounter Diagnosis  Name Primary?   Special screening for malignant neoplasms, colon Yes     Plan: Colonoscopy  The benefits and risks of the planned procedure were described in detail with the patient or (when appropriate) their health care proxy.  Risks were outlined as including, but not limited to, bleeding, infection, perforation, adverse medication reaction leading to cardiac or pulmonary decompensation, pancreatitis (if ERCP).  The limitation of incomplete mucosal visualization was also discussed.  No guarantees or warranties were given.  The patient is appropriate for an endoscopic procedure in the ambulatory setting.   - Amada Jupiter, MD

## 2023-04-18 NOTE — Op Note (Signed)
[facility] Patient Name: Beth Ruiz Procedure Date: 04/18/2023 8:48 AM MRN: [id number] Endoscopist: Sherilyn Cooter L. Myrtie Neither , MD, [id number] Age: 47 Referring MD:  Date of Birth: [birth year] Gender: Female Account #: [id number] Procedure:                Colonoscopy Indications:              Screening for colorectal malignant neoplasm, This                            is the patient's first colonoscopy Medicines:                Monitored Anesthesia Care Procedure:                Pre-Anesthesia Assessment:                           - Prior to the procedure, a History and Physical                            was performed, and patient medications and                            allergies were reviewed. The patient's tolerance of                            previous anesthesia was also reviewed. The risks                            and benefits of the procedure and the sedation                            options and risks were discussed with the patient.                            All questions were answered, and informed consent                            was obtained. Prior Anticoagulants: The patient has                            taken no anticoagulant or antiplatelet agents. ASA                            Grade Assessment: I - A normal, healthy patient.                            After reviewing the risks and benefits, the patient                            was deemed in satisfactory condition to undergo the                            procedure.  After obtaining informed consent, the colonoscope                            was passed under direct vision. Throughout the                            procedure, the patient's blood pressure, pulse, and                            oxygen saturations were monitored continuously. The                            CF [id number] #[id number] was introduced through the anus                            and advanced to the the cecum,  identified by                            appendiceal orifice and ileocecal valve. The                            colonoscopy was performed without difficulty. The                            patient tolerated the procedure well. The quality                            of the bowel preparation was excellent. The                            ileocecal valve, appendiceal orifice, and rectum                            were photographed. Scope In: 8:55:52 AM Scope Out: 9:09:42 AM Scope Withdrawal Time: 0 hours 10 minutes 34 seconds  Total Procedure Duration: 0 hours 13 minutes 50 seconds  Findings:                 The perianal and digital rectal examinations were                            normal.                           Repeat examination of right colon under NBI                            performed.                           The entire examined colon appeared normal on direct                            and retroflexion views. Complications:            No immediate complications. Estimated Blood Loss:  Estimated blood loss: none. Impression:               - The entire examined colon is normal on direct and                            retroflexion views.                           - No specimens collected. Recommendation:           - Patient has a contact number available for                            emergencies. The signs and symptoms of potential                            delayed complications were discussed with the                            patient. Return to normal activities tomorrow.                            Written discharge instructions were provided to the                            patient.                           - Resume previous diet.                           - Continue present medications.                           - Repeat colonoscopy in 10 years for screening                            purposes. Judyann Casasola L. Myrtie Neither, MD 04/18/2023 9:13:05 AM This report has been signed  electronically.

## 2023-04-22 ENCOUNTER — Telehealth: Payer: Self-pay

## 2023-04-22 NOTE — Telephone Encounter (Signed)
No answer, left message to call if having any issues or concerns, B.Shakari Qazi RN

## 2023-04-25 DIAGNOSIS — L910 Hypertrophic scar: Secondary | ICD-10-CM | POA: Diagnosis not present

## 2023-04-30 ENCOUNTER — Other Ambulatory Visit: Payer: Self-pay | Admitting: Obstetrics & Gynecology

## 2023-04-30 DIAGNOSIS — Z1231 Encounter for screening mammogram for malignant neoplasm of breast: Secondary | ICD-10-CM

## 2023-05-09 ENCOUNTER — Ambulatory Visit: Payer: BC Managed Care – PPO | Admitting: Family Medicine

## 2023-05-09 ENCOUNTER — Encounter: Payer: Self-pay | Admitting: Family Medicine

## 2023-05-09 VITALS — BP 122/76 | HR 82 | Temp 98.8°F | Ht 65.75 in | Wt 168.4 lb

## 2023-05-09 DIAGNOSIS — D509 Iron deficiency anemia, unspecified: Secondary | ICD-10-CM

## 2023-05-09 DIAGNOSIS — I10 Essential (primary) hypertension: Secondary | ICD-10-CM

## 2023-05-09 DIAGNOSIS — L659 Nonscarring hair loss, unspecified: Secondary | ICD-10-CM | POA: Diagnosis not present

## 2023-05-09 DIAGNOSIS — R600 Localized edema: Secondary | ICD-10-CM

## 2023-05-09 DIAGNOSIS — Z7689 Persons encountering health services in other specified circumstances: Secondary | ICD-10-CM

## 2023-05-09 DIAGNOSIS — E559 Vitamin D deficiency, unspecified: Secondary | ICD-10-CM

## 2023-05-09 DIAGNOSIS — Z8639 Personal history of other endocrine, nutritional and metabolic disease: Secondary | ICD-10-CM | POA: Diagnosis not present

## 2023-05-09 MED ORDER — OLMESARTAN MEDOXOMIL 20 MG PO TABS: 20.0000 mg | ORAL_TABLET | Freq: Every day | ORAL | 1 refills | Status: DC

## 2023-05-09 NOTE — Patient Instructions (Addendum)
It was nice meeting you today.  Several orders for labs were placed.  You can have these done at your convenience.  A refill on olmesartan 20 mg was sent to your [facility] pharmacy.  You can follow-up in the next few months to reevaluate symptoms.

## 2023-05-09 NOTE — Progress Notes (Signed)
Office Visit   Subjective  Patient ID: Burnadette Baskett, female    DOB: Oct 03, 1975  Age: 47 y.o. MRN: [id number]  No chief complaint on file.   Patient is a 47 year old female previously seen by Dr. Maryelizabeth Rowan who presents for establish care and follow-up on chronic conditions.  HTN: On olmesartan 20 mg daily.  On med x 8 or 9 years.  Not currently checking BP at home.  Endorses eating more fast food.  Was walking for exercise but has not recently.  Thyroid issues: Hospitalized x 3 days in 2017.  At the time diagnosed with hyperthyroidism/Graves' disease.  Took medication.  Then developed hypothyroidism.  Was on medication but is not currently.  Hair thinning: Patient notes thinning of hair and frontal area/temples x 1 year.  Denies changes in hair products of wearing protective styles such as braids.  Questions if BP meds was causing.  LE edema: Patient notes intermittent LE edema worse by the end of the day.  Has indention around ankles from socks.  Resolves in the morning.  Endorses increased intake of fast food.  Sedentary at work.  Had less edema when walking regularly for exercise.  History of vitamin D deficiency: States level was 7.  Took weekly ergocalciferol.  Now taking OTC vitamin D daily.  At the time of diagnosis felt fatigued. Allergies: NKDA  Social history: Pt is single.  She works in the office at [employer].  Patient denies alcohol, tobacco, drug use.    Patient Active Problem List   Diagnosis Date Noted   Impacted teeth 03/04/2019   Jaw pain 03/04/2019   Anemia 08/14/2018   Uterine leiomyoma 08/14/2018   Goiter diffuse 04/12/2016   Hyperthyroidism 04/12/2016   Uncontrollable vomiting    Hypertensive disorder 02/11/2016   Tachycardia 02/11/2016   Past Medical History:  Diagnosis Date   Anemia    Arthritis    Joints of hands   Graves disease    Hypertension    Thyroid disease    No meds gets testing every 6 months   Past Surgical  History:  Procedure Laterality Date   LESION REMOVAL Right    Jaw   WISDOM TOOTH EXTRACTION     Social History   Tobacco Use   Smoking status: Never  Vaping Use   Vaping status: Never Used  Substance Use Topics   Alcohol use: No   Drug use: No   Family History  Problem Relation Age of Onset   Hypertension Mother    Hypertension Father    Uterine cancer Paternal Aunt    Colon cancer Maternal Grandmother    Diabetes Maternal Grandmother    Hypertension Maternal Grandmother    Colon polyps Neg Hx    Esophageal cancer Neg Hx    Rectal cancer Neg Hx    Stomach cancer Neg Hx    No Known Allergies    ROS Negative unless stated above    Objective:     BP 122/76 (BP Location: Left Arm, Patient Position: Sitting, Cuff Size: Normal)   Pulse 82   Temp 98.8 F (37.1 C) (Oral)   Ht 5' 5.75" (1.67 m)   Wt 168 lb 6.4 oz (76.4 kg)   LMP 04/10/2023 (Exact Date)   SpO2 97%   BMI 27.39 kg/m  BP Readings from Last 3 Encounters:  05/09/23 122/76  04/18/23 130/71  05/01/22 110/70   Wt Readings from Last 3 Encounters:  05/09/23 168 lb 6.4 oz (76.4 kg)  04/18/23 168  lb (76.2 kg)  04/04/23 168 lb (76.2 kg)      Physical Exam Constitutional:      General: She is not in acute distress.    Appearance: Normal appearance.  HENT:     Head: Normocephalic and atraumatic.     Nose: Nose normal.     Mouth/Throat:     Mouth: Mucous membranes are moist.  Neck:     Thyroid: No thyromegaly or thyroid tenderness.  Cardiovascular:     Rate and Rhythm: Normal rate and regular rhythm.     Heart sounds: Normal heart sounds. No murmur heard.    No gallop.  Pulmonary:     Effort: Pulmonary effort is normal. No respiratory distress.     Breath sounds: Normal breath sounds. No wheezing, rhonchi or rales.  Musculoskeletal:     Cervical back: No tenderness.  Lymphadenopathy:     Cervical: No cervical adenopathy.  Skin:    General: Skin is warm and dry.  Neurological:     Mental  Status: She is alert and oriented to person, place, and time.      No results found for any visits on 05/09/23.    Assessment & Plan:  Thinning hair -     Olmesartan Medoxomil; Take 1 tablet (20 mg total) by mouth daily.  Dispense: 90 tablet; Refill: 1 -     TSH; Future -     T4, free; Future -     CBC with Differential/Platelet; Future -     Iron, TIBC and Ferritin Panel; Future -     VITAMIN D 25 Hydroxy (Vit-D Deficiency, Fractures); Future -     Comprehensive metabolic panel; Future  History of thyroid disorder -     TSH; Future -     T4, free; Future  Essential hypertension -Stable -Continue olmesartan 20 mg daily -Discussed importance of lifestyle modifications including decreasing sodium intake -     Olmesartan Medoxomil; Take 1 tablet (20 mg total) by mouth daily.  Dispense: 90 tablet; Refill: 1 -     TSH; Future -     T4, free; Future -     Comprehensive metabolic panel; Future  Encounter to establish care -We reviewed the PMH, PSH, FH, SH, Meds and Allergies. -We provided refills for any medications we will prescribe as needed. -We addressed current concerns per orders and patient instructions. -We have asked for records for pertinent exams, studies, vaccines and notes from previous providers. -We have advised patient to follow up per instructions below.   Iron deficiency anemia, unspecified iron deficiency anemia type -Continue iron supplement daily -     CBC with Differential/Platelet; Future -     Iron, TIBC and Ferritin Panel; Future  Bilateral lower extremity edema -Likely dependent -Discussed wearing compression socks, elevating LEs, decreasing sodium intake -Continue to monitor  Vitamin D deficiency -States vitamin D was 7 -Currently taking OTC vitamin D daily        -      Vitamin D 25 hydroxy; Future  Return in about 3 months (around 08/09/2023), or if symptoms worsen or fail to improve.   Deeann Saint, MD

## 2023-05-13 ENCOUNTER — Encounter: Payer: Self-pay | Admitting: Family Medicine

## 2023-05-21 ENCOUNTER — Ambulatory Visit
Admission: RE | Admit: 2023-05-21 | Discharge: 2023-05-21 | Disposition: A | Discharge status: Home / Self Care | Payer: BC Managed Care – PPO | Source: Ambulatory Visit | Attending: Obstetrics & Gynecology | Admitting: Obstetrics & Gynecology

## 2023-05-21 DIAGNOSIS — Z1231 Encounter for screening mammogram for malignant neoplasm of breast: Secondary | ICD-10-CM | POA: Diagnosis not present

## 2023-06-04 ENCOUNTER — Telehealth: Payer: Self-pay | Admitting: Family Medicine

## 2023-06-04 NOTE — Telephone Encounter (Signed)
Pt is calling and would like lab order from oct to be fax to [facility] the fax number [phone]

## 2023-06-05 ENCOUNTER — Other Ambulatory Visit: Payer: Self-pay

## 2023-06-05 DIAGNOSIS — E0590 Thyrotoxicosis, unspecified without thyrotoxic crisis or storm: Secondary | ICD-10-CM

## 2023-06-05 DIAGNOSIS — Z8639 Personal history of other endocrine, nutritional and metabolic disease: Secondary | ICD-10-CM

## 2023-06-05 DIAGNOSIS — E559 Vitamin D deficiency, unspecified: Secondary | ICD-10-CM

## 2023-06-05 DIAGNOSIS — D509 Iron deficiency anemia, unspecified: Secondary | ICD-10-CM

## 2023-06-05 DIAGNOSIS — I10 Essential (primary) hypertension: Secondary | ICD-10-CM

## 2023-06-05 NOTE — Telephone Encounter (Signed)
Called patient left a VM, labs have been changed to [facility] and faxed to the number provided by the patient [phone]

## 2023-06-12 DIAGNOSIS — L910 Hypertrophic scar: Secondary | ICD-10-CM | POA: Diagnosis not present

## 2023-06-12 DIAGNOSIS — L819 Disorder of pigmentation, unspecified: Secondary | ICD-10-CM | POA: Diagnosis not present

## 2023-08-09 ENCOUNTER — Ambulatory Visit: Payer: BC Managed Care – PPO | Admitting: Family Medicine

## 2023-09-05 DIAGNOSIS — L910 Hypertrophic scar: Secondary | ICD-10-CM | POA: Diagnosis not present

## 2023-11-23 ENCOUNTER — Other Ambulatory Visit: Payer: Self-pay | Admitting: Family Medicine

## 2023-11-23 DIAGNOSIS — L659 Nonscarring hair loss, unspecified: Secondary | ICD-10-CM

## 2023-11-23 DIAGNOSIS — I10 Essential (primary) hypertension: Secondary | ICD-10-CM

## 2024-01-30 NOTE — Progress Notes (Signed)
 [facility] Quality Team Note  Name: Beth Ruiz Date of Birth: Aug 19, 1975 MRN: [id number] Date: 01/30/2024  [facility] Quality Team has reviewed this patient's chart, please see recommendations below:  [facility] Quality Other; (CHART REVIEWED. NEED CONTROLLING BLOOD PRESSURE LESS THAN 140/90 FOR GAP CLOSURE)

## 2024-02-10 ENCOUNTER — Ambulatory Visit (INDEPENDENT_AMBULATORY_CARE_PROVIDER_SITE_OTHER): Admitting: Family Medicine

## 2024-02-10 VITALS — BP 148/82 | HR 101 | Temp 99.2°F | Ht 65.75 in | Wt 176.8 lb

## 2024-02-10 DIAGNOSIS — D509 Iron deficiency anemia, unspecified: Secondary | ICD-10-CM

## 2024-02-10 DIAGNOSIS — E559 Vitamin D deficiency, unspecified: Secondary | ICD-10-CM

## 2024-02-10 DIAGNOSIS — Z0000 Encounter for general adult medical examination without abnormal findings: Secondary | ICD-10-CM | POA: Diagnosis not present

## 2024-02-10 DIAGNOSIS — I10 Essential (primary) hypertension: Secondary | ICD-10-CM | POA: Diagnosis not present

## 2024-02-10 DIAGNOSIS — Z1159 Encounter for screening for other viral diseases: Secondary | ICD-10-CM

## 2024-02-10 DIAGNOSIS — Z8639 Personal history of other endocrine, nutritional and metabolic disease: Secondary | ICD-10-CM | POA: Diagnosis not present

## 2024-02-10 NOTE — Progress Notes (Signed)
 Established Patient Office Visit   Subjective  Patient ID: Beth Ruiz, female    DOB: 1975-08-08  Age: 48 y.o. MRN: [id number]  Chief Complaint  Patient presents with   Annual Exam    Pt is a 48 yo female seen for CPE.  Patient is not fasting.  States she has been doing well overall.  Has an upcoming appointment with gynecology for well woman exam.  Notes weight gain.  Purchased a walking pad and plans to use regularly.  Eating at [employer] 2 times per week.  Not checking BP.  May be moving soon to [place] to help care for her father who was recently diagnosed with cancer.    Patient Active Problem List   Diagnosis Date Noted   Impacted teeth 03/04/2019   Jaw pain 03/04/2019   Anemia 08/14/2018   Uterine leiomyoma 08/14/2018   Goiter diffuse 04/12/2016   Hyperthyroidism 04/12/2016   Uncontrollable vomiting    Hypertensive disorder 02/11/2016   Tachycardia 02/11/2016   Past Medical History:  Diagnosis Date   Anemia    Anxiety    Arthritis    Joints of hands   GERD (gastroesophageal reflux disease)    Graves disease    Hypertension    Thyroid  disease    No meds gets testing every 6 months   Past Surgical History:  Procedure Laterality Date   LESION REMOVAL Right    Jaw   WISDOM TOOTH EXTRACTION     Social History   Tobacco Use   Smoking status: Never  Vaping Use   Vaping status: Never Used  Substance Use Topics   Alcohol use: No   Drug use: No   Family History  Problem Relation Age of Onset   Hypertension Mother    Drug abuse Mother    Stroke Mother    Hypertension Father    Cancer Father        prostate   Colon cancer Maternal Grandmother    Diabetes Maternal Grandmother    Hypertension Maternal Grandmother    Cancer Maternal Grandmother    COPD Paternal Grandmother    Drug abuse Paternal Grandmother    High blood pressure Paternal Grandmother    Colon cancer Paternal Grandmother    Uterine cancer Paternal Aunt    Cancer Paternal  Aunt    Colon polyps Neg Hx    Esophageal cancer Neg Hx    Rectal cancer Neg Hx    Stomach cancer Neg Hx    No Known Allergies  ROS Negative unless stated above    Objective:     BP (!) 148/82 (BP Location: Left Arm, Patient Position: Sitting, Cuff Size: Normal)   Pulse (!) 101   Temp 99.2 F (37.3 C) (Oral)   Ht 5' 5.75 (1.67 m)   Wt 176 lb 12.8 oz (80.2 kg)   LMP 01/05/2024 (Exact Date)   SpO2 96%   BMI 28.75 kg/m  BP Readings from Last 3 Encounters:  02/10/24 (!) 148/82  05/09/23 122/76  04/18/23 130/71   Wt Readings from Last 3 Encounters:  02/10/24 176 lb 12.8 oz (80.2 kg)  05/09/23 168 lb 6.4 oz (76.4 kg)  04/18/23 168 lb (76.2 kg)      Physical Exam Constitutional:      Appearance: Normal appearance.  HENT:     Head: Normocephalic and atraumatic.     Right Ear: Tympanic membrane, ear canal and external ear normal.     Left Ear: Tympanic membrane, ear canal and external  ear normal.     Nose: Nose normal.     Mouth/Throat:     Mouth: Mucous membranes are moist.     Pharynx: No oropharyngeal exudate or posterior oropharyngeal erythema.  Eyes:     General: No scleral icterus.    Extraocular Movements: Extraocular movements intact.     Conjunctiva/sclera: Conjunctivae normal.     Pupils: Pupils are equal, round, and reactive to light.  Neck:     Thyroid : No thyromegaly.  Cardiovascular:     Rate and Rhythm: Normal rate and regular rhythm.     Pulses: Normal pulses.     Heart sounds: Normal heart sounds. No murmur heard.    No friction rub.  Pulmonary:     Effort: Pulmonary effort is normal.     Breath sounds: Normal breath sounds. No wheezing, rhonchi or rales.  Abdominal:     General: Bowel sounds are normal.     Palpations: Abdomen is soft.     Tenderness: There is no abdominal tenderness.  Musculoskeletal:        General: No deformity. Normal range of motion.  Lymphadenopathy:     Cervical: No cervical adenopathy.  Skin:    General: Skin  is warm and dry.     Findings: No lesion.  Neurological:     General: No focal deficit present.     Mental Status: She is alert and oriented to person, place, and time.  Psychiatric:        Mood and Affect: Mood normal.        Thought Content: Thought content normal.        02/10/2024    4:08 PM 05/09/2023    9:11 AM  Depression screen PHQ 2/9  Decreased Interest 0 0  Down, Depressed, Hopeless 0 0  PHQ - 2 Score 0 0  Altered sleeping 0 0  Tired, decreased energy 0 0  Change in appetite 0 0  Feeling bad or failure about yourself  0 0  Trouble concentrating 0 0  Moving slowly or fidgety/restless 0 0  Suicidal thoughts 0 0  PHQ-9 Score 0 0  Difficult doing work/chores  Not difficult at all      02/10/2024    4:08 PM 05/09/2023    9:11 AM  GAD 7 : Generalized Anxiety Score  Nervous, Anxious, on Edge 1 1  Control/stop worrying 0 0  Worry too much - different things 0 0  Trouble relaxing 0 0  Restless 0 0  Easily annoyed or irritable 0 0  Afraid - awful might happen 0 0  Total GAD 7 Score 1 1  Anxiety Difficulty Not difficult at all Not difficult at all     No results found for any visits on 02/10/24.    Assessment & Plan:   Well adult exam -     CBC with Differential/Platelet; Future -     Comprehensive metabolic panel with GFR; Future -     Hemoglobin A1c; Future -     Lipid panel; Future -     T4, free; Future -     TSH; Future  Vitamin D deficiency -     VITAMIN D 25 Hydroxy (Vit-D Deficiency, Fractures); Future  History of thyroid  disorder -     T4, free; Future -     TSH; Future  Essential hypertension -     Comprehensive metabolic panel with GFR; Future -     Lipid panel; Future -     T4, free;  Future -     TSH; Future  Iron deficiency anemia, unspecified iron deficiency anemia type -     CBC with Differential/Platelet; Future -     Iron, TIBC and Ferritin Panel; Future  Age-appropriate health screenings discussed.  Patient will return for  labs tomorrow morning when fasting.  Immunizations reviewed.  Mammogram up-to-date.  Colonoscopy done 04/18/2023.  Upcoming well woman exam with OB/GYN.  BP elevated, not currently on medication.  Discussed lifestyle modifications including decreasing sodium intake and increasing physical activity.  Return in about 3 months (around 05/12/2024), or if symptoms worsen or fail to improve.   Clotilda JONELLE Single, MD

## 2024-02-11 ENCOUNTER — Other Ambulatory Visit

## 2024-02-14 ENCOUNTER — Other Ambulatory Visit (INDEPENDENT_AMBULATORY_CARE_PROVIDER_SITE_OTHER)

## 2024-02-14 DIAGNOSIS — E559 Vitamin D deficiency, unspecified: Secondary | ICD-10-CM | POA: Diagnosis not present

## 2024-02-14 DIAGNOSIS — Z1159 Encounter for screening for other viral diseases: Secondary | ICD-10-CM

## 2024-02-14 DIAGNOSIS — Z8639 Personal history of other endocrine, nutritional and metabolic disease: Secondary | ICD-10-CM

## 2024-02-14 DIAGNOSIS — Z0000 Encounter for general adult medical examination without abnormal findings: Secondary | ICD-10-CM | POA: Diagnosis not present

## 2024-02-14 DIAGNOSIS — I10 Essential (primary) hypertension: Secondary | ICD-10-CM

## 2024-02-14 DIAGNOSIS — D509 Iron deficiency anemia, unspecified: Secondary | ICD-10-CM | POA: Diagnosis not present

## 2024-02-14 LAB — LIPID PANEL
Cholesterol: 195 mg/dL (ref 0–200)
HDL: 55.9 mg/dL (ref >39.00)
LDL Cholesterol: 112 mg/dL — ABNORMAL HIGH (ref 0–99)
NonHDL: 138.63
Total CHOL/HDL Ratio: 3
Triglycerides: 132 mg/dL (ref 0.0–149.0)
VLDL: 26.4 mg/dL (ref 0.0–40.0)

## 2024-02-14 LAB — CBC WITH DIFFERENTIAL/PLATELET
Basophils Absolute: 0 K/uL (ref 0.0–0.1)
Basophils Relative: 0.7 % (ref 0.0–3.0)
Eosinophils Absolute: 0.2 K/uL (ref 0.0–0.7)
Eosinophils Relative: 3.2 % (ref 0.0–5.0)
HCT: 42.8 % (ref 36.0–46.0)
Hemoglobin: 13.9 g/dL (ref 12.0–15.0)
Lymphocytes Relative: 27.4 % (ref 12.0–46.0)
Lymphs Abs: 1.5 K/uL (ref 0.7–4.0)
MCHC: 32.4 g/dL (ref 30.0–36.0)
MCV: 86.3 fl (ref 78.0–100.0)
Monocytes Absolute: 0.6 K/uL (ref 0.1–1.0)
Monocytes Relative: 10.6 % (ref 3.0–12.0)
Neutro Abs: 3.2 K/uL (ref 1.4–7.7)
Neutrophils Relative %: 58.1 % (ref 43.0–77.0)
Platelets: 189 K/uL (ref 150.0–400.0)
RBC: 4.96 Mil/uL (ref 3.87–5.11)
RDW: 14.6 % (ref 11.5–15.5)
WBC: 5.6 K/uL (ref 4.0–10.5)

## 2024-02-14 LAB — COMPREHENSIVE METABOLIC PANEL WITH GFR
ALT: 38 U/L — ABNORMAL HIGH (ref 0–35)
AST: 27 U/L (ref 0–37)
Albumin: 4.3 g/dL (ref 3.5–5.2)
Alkaline Phosphatase: 72 U/L (ref 39–117)
BUN: 10 mg/dL (ref 6–23)
CO2: 23 meq/L (ref 19–32)
Calcium: 9.3 mg/dL (ref 8.4–10.5)
Chloride: 104 meq/L (ref 96–112)
Creatinine, Ser: 0.83 mg/dL (ref 0.40–1.20)
GFR: 84.8 mL/min (ref >60.00)
Glucose, Bld: 96 mg/dL (ref 70–99)
Potassium: 4.1 meq/L (ref 3.5–5.1)
Sodium: 136 meq/L (ref 135–145)
Total Bilirubin: 0.4 mg/dL (ref 0.2–1.2)
Total Protein: 7.6 g/dL (ref 6.0–8.3)

## 2024-02-14 LAB — HEMOGLOBIN A1C: Hgb A1c MFr Bld: 5.5 % (ref 4.6–6.5)

## 2024-02-14 LAB — VITAMIN D 25 HYDROXY (VIT D DEFICIENCY, FRACTURES): VITD: 21.72 ng/mL — ABNORMAL LOW (ref 30.00–100.00)

## 2024-02-14 LAB — TSH: TSH: 2.79 u[IU]/mL (ref 0.35–5.50)

## 2024-02-14 LAB — T4, FREE: Free T4: 0.72 ng/dL (ref 0.60–1.60)

## 2024-02-15 LAB — IRON,TIBC AND FERRITIN PANEL
%SAT: 40 % (ref 16–45)
Ferritin: 25 ng/mL (ref 16–232)
Iron: 118 ug/dL (ref 40–190)
TIBC: 298 ug/dL (ref 250–450)

## 2024-02-15 LAB — HEPATITIS C ANTIBODY: Hepatitis C Ab: NONREACTIVE

## 2024-02-20 ENCOUNTER — Ambulatory Visit: Payer: Self-pay | Admitting: Family Medicine

## 2024-02-20 DIAGNOSIS — E559 Vitamin D deficiency, unspecified: Secondary | ICD-10-CM

## 2024-02-20 MED ORDER — VITAMIN D (ERGOCALCIFEROL) 1.25 MG (50000 UNIT) PO CAPS: 50000.0000 [IU] | ORAL_CAPSULE | ORAL | 0 refills | Status: DC

## 2024-02-29 ENCOUNTER — Other Ambulatory Visit: Payer: Self-pay | Admitting: Family Medicine

## 2024-02-29 DIAGNOSIS — L659 Nonscarring hair loss, unspecified: Secondary | ICD-10-CM

## 2024-02-29 DIAGNOSIS — I10 Essential (primary) hypertension: Secondary | ICD-10-CM

## 2024-03-24 DIAGNOSIS — L814 Other melanin hyperpigmentation: Secondary | ICD-10-CM | POA: Diagnosis not present

## 2024-03-24 DIAGNOSIS — D2261 Melanocytic nevi of right upper limb, including shoulder: Secondary | ICD-10-CM | POA: Diagnosis not present

## 2024-03-24 DIAGNOSIS — D485 Neoplasm of uncertain behavior of skin: Secondary | ICD-10-CM | POA: Diagnosis not present

## 2024-03-24 DIAGNOSIS — D229 Melanocytic nevi, unspecified: Secondary | ICD-10-CM | POA: Diagnosis not present

## 2024-03-24 DIAGNOSIS — L910 Hypertrophic scar: Secondary | ICD-10-CM | POA: Diagnosis not present

## 2024-03-24 DIAGNOSIS — L821 Other seborrheic keratosis: Secondary | ICD-10-CM | POA: Diagnosis not present

## 2024-03-24 DIAGNOSIS — L578 Other skin changes due to chronic exposure to nonionizing radiation: Secondary | ICD-10-CM | POA: Diagnosis not present

## 2024-04-14 DIAGNOSIS — Z6828 Body mass index (BMI) 28.0-28.9, adult: Secondary | ICD-10-CM | POA: Diagnosis not present

## 2024-04-14 DIAGNOSIS — R319 Hematuria, unspecified: Secondary | ICD-10-CM | POA: Diagnosis not present

## 2024-04-14 DIAGNOSIS — Z01419 Encounter for gynecological examination (general) (routine) without abnormal findings: Secondary | ICD-10-CM | POA: Diagnosis not present

## 2024-05-05 DIAGNOSIS — N760 Acute vaginitis: Secondary | ICD-10-CM | POA: Diagnosis not present

## 2024-05-05 DIAGNOSIS — R319 Hematuria, unspecified: Secondary | ICD-10-CM | POA: Diagnosis not present

## 2024-05-05 DIAGNOSIS — Z87448 Personal history of other diseases of urinary system: Secondary | ICD-10-CM | POA: Diagnosis not present

## 2024-05-10 ENCOUNTER — Other Ambulatory Visit: Payer: Self-pay | Admitting: Family Medicine

## 2024-05-10 DIAGNOSIS — E559 Vitamin D deficiency, unspecified: Secondary | ICD-10-CM

## 2024-05-30 ENCOUNTER — Other Ambulatory Visit: Payer: Self-pay | Admitting: Family Medicine

## 2024-05-30 DIAGNOSIS — L659 Nonscarring hair loss, unspecified: Secondary | ICD-10-CM

## 2024-05-30 DIAGNOSIS — I10 Essential (primary) hypertension: Secondary | ICD-10-CM

## 2024-06-01 ENCOUNTER — Other Ambulatory Visit: Payer: Self-pay | Admitting: Family Medicine

## 2024-06-01 DIAGNOSIS — Z1231 Encounter for screening mammogram for malignant neoplasm of breast: Secondary | ICD-10-CM

## 2024-06-02 ENCOUNTER — Ambulatory Visit
Admission: RE | Admit: 2024-06-02 | Discharge: 2024-06-02 | Disposition: A | Discharge status: Home / Self Care | Source: Ambulatory Visit | Attending: Family Medicine | Admitting: Family Medicine

## 2024-06-02 DIAGNOSIS — Z1231 Encounter for screening mammogram for malignant neoplasm of breast: Secondary | ICD-10-CM

## 2024-06-15 ENCOUNTER — Ambulatory Visit: Admitting: Family Medicine

## 2024-06-15 VITALS — BP 146/82 | HR 95 | Temp 98.8°F | Ht 65.75 in | Wt 179.2 lb

## 2024-06-15 DIAGNOSIS — E860 Dehydration: Secondary | ICD-10-CM | POA: Diagnosis not present

## 2024-06-15 DIAGNOSIS — M545 Low back pain, unspecified: Secondary | ICD-10-CM

## 2024-06-15 LAB — POC URINALSYSI DIPSTICK (AUTOMATED)
Bilirubin, UA: NEGATIVE
Blood, UA: NEGATIVE
Glucose, UA: NEGATIVE
Ketones, UA: NEGATIVE
Leukocytes, UA: NEGATIVE
Nitrite, UA: NEGATIVE
Protein, UA: POSITIVE — AB
Spec Grav, UA: >=1.03 — AB (ref 1.010–1.025)
Urobilinogen, UA: 0.2 U/dL
pH, UA: 6 (ref 5.0–8.0)

## 2024-06-15 NOTE — Progress Notes (Signed)
 Established Patient Office Visit   Subjective  Patient ID: Beth Ruiz, female    DOB: 1975/11/30  Age: 48 y.o. MRN: [id number]  Chief Complaint  Patient presents with   Acute Visit    Patient came in today for bilateral flank pain started 2 1/2 weeks dark color, rate of pain 2/10     Pt is a 48 yo female seen for acute concern.  Pt with abn UA on routine exam with OB/Gyn in early Oct. Was asymptomatic.  Treated for UTI. Repeat UA was negative.  Pt now with dark colored urine, b/l lateral mid/low back ache x 2 wks.  Symptoms worse in the evening.  Pt denies fever, chills, n/v, constipation, pain that moves, hematuria.  Pt does not drink much water during the day.  Takes a 64 oz water bottle to work, but tries to catch up in the evening. May have a Dr. Nunzio 3x/wk.    Patient Active Problem List   Diagnosis Date Noted   Impacted teeth 03/04/2019   Jaw pain 03/04/2019   Anemia 08/14/2018   Uterine leiomyoma 08/14/2018   Goiter diffuse 04/12/2016   Hyperthyroidism 04/12/2016   Uncontrollable vomiting    Hypertensive disorder 02/11/2016   Tachycardia 02/11/2016   Past Medical History:  Diagnosis Date   Anemia    Anxiety    Arthritis    Joints of hands   GERD (gastroesophageal reflux disease)    Graves disease    Hypertension    Thyroid  disease    No meds gets testing every 6 months   Past Surgical History:  Procedure Laterality Date   LESION REMOVAL Right    Jaw   WISDOM TOOTH EXTRACTION     Social History   Tobacco Use   Smoking status: Never  Vaping Use   Vaping status: Never Used  Substance Use Topics   Alcohol use: No   Drug use: No   Family History  Problem Relation Age of Onset   Hypertension Mother    Drug abuse Mother    Stroke Mother    Hypertension Father    Cancer Father        prostate   Uterine cancer Paternal Aunt    Cancer Paternal Aunt    Colon cancer Maternal Grandmother    Diabetes Maternal Grandmother    Hypertension Maternal  Grandmother    Cancer Maternal Grandmother    COPD Paternal Grandmother    Drug abuse Paternal Grandmother    High blood pressure Paternal Grandmother    Colon cancer Paternal Grandmother    Colon polyps Neg Hx    Esophageal cancer Neg Hx    Rectal cancer Neg Hx    Stomach cancer Neg Hx    Breast cancer Neg Hx    No Known Allergies  ROS Negative unless stated above    Objective:     BP (!) 146/82 (BP Location: Left Arm, Patient Position: Sitting, Cuff Size: Large)   Pulse 95   Temp 98.8 F (37.1 C) (Oral)   Ht 5' 5.75 (1.67 m)   Wt 179 lb 3.2 oz (81.3 kg)   SpO2 100%   BMI 29.14 kg/m  BP Readings from Last 3 Encounters:  06/15/24 (!) 146/82  02/10/24 (!) 148/82  05/09/23 122/76   Wt Readings from Last 3 Encounters:  06/15/24 179 lb 3.2 oz (81.3 kg)  02/10/24 176 lb 12.8 oz (80.2 kg)  05/09/23 168 lb 6.4 oz (76.4 kg)      Physical Exam  Constitutional:      General: She is not in acute distress.    Appearance: Normal appearance.  HENT:     Head: Normocephalic and atraumatic.     Nose: Nose normal.     Mouth/Throat:     Mouth: Mucous membranes are pale and dry.  Cardiovascular:     Rate and Rhythm: Normal rate and regular rhythm.     Heart sounds: Normal heart sounds. No murmur heard.    No gallop.  Pulmonary:     Effort: Pulmonary effort is normal. No respiratory distress.     Breath sounds: Normal breath sounds. No wheezing, rhonchi or rales.  Abdominal:     General: Bowel sounds are normal. There is no distension.     Palpations: Abdomen is soft.     Tenderness: There is no abdominal tenderness. There is no right CVA tenderness, left CVA tenderness, guarding or rebound.  Skin:    General: Skin is warm and dry.  Neurological:     Mental Status: She is alert and oriented to person, place, and time.        02/10/2024    4:08 PM 05/09/2023    9:11 AM  Depression screen PHQ 2/9  Decreased Interest 0 0  Down, Depressed, Hopeless 0 0  PHQ - 2 Score  0 0  Altered sleeping 0 0  Tired, decreased energy 0 0  Change in appetite 0 0  Feeling bad or failure about yourself  0 0  Trouble concentrating 0 0  Moving slowly or fidgety/restless 0 0  Suicidal thoughts 0 0  PHQ-9 Score 0  0   Difficult doing work/chores  Not difficult at all     Data saved with a previous flowsheet row definition      02/10/2024    4:08 PM 05/09/2023    9:11 AM  GAD 7 : Generalized Anxiety Score  Nervous, Anxious, on Edge 1 1  Control/stop worrying 0 0  Worry too much - different things 0 0  Trouble relaxing 0 0  Restless 0 0  Easily annoyed or irritable 0 0  Afraid - awful might happen 0 0  Total GAD 7 Score 1 1  Anxiety Difficulty Not difficult at all Not difficult at all     Results for orders placed or performed in visit on 06/15/24  POCT Urinalysis Dipstick (Automated)  Result Value Ref Range   Color, UA dark yellow    Clarity, UA clear    Glucose, UA Negative Negative   Bilirubin, UA neg    Ketones, UA neg    Spec Grav, UA >=1.030 (A) 1.010 - 1.025   Blood, UA neg    pH, UA 6.0 5.0 - 8.0   Protein, UA Positive (A) Negative   Urobilinogen, UA 0.2 0.2 or 1.0 E.U./dL   Nitrite, UA neg    Leukocytes, UA Negative Negative      Assessment & Plan:   Dehydration  Acute bilateral low back pain without sciatica -     POCT Urinalysis Dipstick (Automated)  Pt with concern for return of UTI.  POC UA with concentrated urine, dark yellow, SG 1.030.  No s/s of infection or renal calculi.  Sx likely 2/2 dehydration.  Advised to increased intake of water and fluids.  Decrease/stop sodas.  No follow-ups on file.   Clotilda JONELLE Single, MD

## 2024-07-09 DIAGNOSIS — L439 Lichen planus, unspecified: Secondary | ICD-10-CM | POA: Diagnosis not present

## 2024-07-09 DIAGNOSIS — N949 Unspecified condition associated with female genital organs and menstrual cycle: Secondary | ICD-10-CM | POA: Diagnosis not present

## 2024-08-27 ENCOUNTER — Encounter: Payer: Self-pay | Admitting: Family Medicine

## 2024-08-27 DIAGNOSIS — L659 Nonscarring hair loss, unspecified: Secondary | ICD-10-CM

## 2024-08-27 DIAGNOSIS — I10 Essential (primary) hypertension: Secondary | ICD-10-CM

## 2024-08-28 MED ORDER — OLMESARTAN MEDOXOMIL 20 MG PO TABS: 20.0000 mg | ORAL_TABLET | Freq: Every day | ORAL | 2 refills | Status: DC

## 2024-09-01 ENCOUNTER — Other Ambulatory Visit: Payer: Self-pay

## 2024-09-01 DIAGNOSIS — L659 Nonscarring hair loss, unspecified: Secondary | ICD-10-CM

## 2024-09-01 DIAGNOSIS — I10 Essential (primary) hypertension: Secondary | ICD-10-CM

## 2024-09-01 MED ORDER — OLMESARTAN MEDOXOMIL 20 MG PO TABS: 20.0000 mg | ORAL_TABLET | Freq: Every day | ORAL | 2 refills | Status: AC

## 2024-09-02 ENCOUNTER — Other Ambulatory Visit: Payer: Self-pay | Admitting: Family Medicine

## 2024-09-02 DIAGNOSIS — L659 Nonscarring hair loss, unspecified: Secondary | ICD-10-CM

## 2024-09-02 DIAGNOSIS — I10 Essential (primary) hypertension: Secondary | ICD-10-CM
